# Patient Record
Sex: Male | Born: 1950 | Race: White | Hispanic: No | Marital: Married | Smoking: Never smoker
Health system: Southern US, Community
[De-identification: ages and names within clinical notes are randomized; demographics above are authoritative.]

## PROBLEM LIST (undated history)

## (undated) DIAGNOSIS — K649 Unspecified hemorrhoids: Secondary | ICD-10-CM

## (undated) DIAGNOSIS — D649 Anemia, unspecified: Secondary | ICD-10-CM

## (undated) DIAGNOSIS — K56609 Unspecified intestinal obstruction, unspecified as to partial versus complete obstruction: Secondary | ICD-10-CM

## (undated) DIAGNOSIS — K4090 Unilateral inguinal hernia, without obstruction or gangrene, not specified as recurrent: Secondary | ICD-10-CM

## (undated) HISTORY — DX: Unspecified hemorrhoids: K64.9

## (undated) HISTORY — DX: Unspecified intestinal obstruction, unspecified as to partial versus complete obstruction: K56.609

## (undated) HISTORY — PX: UMBILICAL HERNIA REPAIR: SHX196

## (undated) HISTORY — DX: Unilateral inguinal hernia, without obstruction or gangrene, not specified as recurrent: K40.90

## (undated) HISTORY — PX: TONSILLECTOMY: SUR1361

## (undated) HISTORY — DX: Anemia, unspecified: D64.9

---

## 2001-09-21 HISTORY — PX: COLONOSCOPY: SHX174

## 2001-09-22 ENCOUNTER — Encounter: Payer: Self-pay | Admitting: Internal Medicine

## 2001-09-24 ENCOUNTER — Encounter: Payer: Self-pay | Admitting: Internal Medicine

## 2009-11-26 ENCOUNTER — Encounter (INDEPENDENT_AMBULATORY_CARE_PROVIDER_SITE_OTHER): Payer: Self-pay | Admitting: *Deleted

## 2010-01-17 DIAGNOSIS — K649 Unspecified hemorrhoids: Secondary | ICD-10-CM | POA: Insufficient documentation

## 2010-01-17 DIAGNOSIS — K5900 Constipation, unspecified: Secondary | ICD-10-CM

## 2010-01-17 DIAGNOSIS — Z862 Personal history of diseases of the blood and blood-forming organs and certain disorders involving the immune mechanism: Secondary | ICD-10-CM

## 2010-07-24 NOTE — Letter (Signed)
Summary: New Patient letter  [facility]  [address] [place], [place] [address]   Phone: [phone]  Fax: [phone]       11/26/2009 MRN: [id number]  [facility] [address] [place], [place]  [address]  Dear Randy Morrison,  Welcome to the Gastroenterology Division at [facility].    You are scheduled to see Dr. Juanda Chance on 01/30/2010 at 1:30PM on the 3rd floor at [facility], [address]  We ask that you try to arrive at our office 15 minutes prior to your appointment time to allow for check-in.  We would like you to complete the enclosed self-administered evaluation form prior to your visit and bring it with you on the day of your appointment.  We will review it with you.  Also, please bring a complete list of all your medications or, if you prefer, bring the medication bottles and we will list them.  Please bring your insurance card so that we may make a copy of it.  If your insurance requires a referral to see a specialist, please bring your referral form from your primary care physician.  Co-payments are due at the time of your visit and may be paid by cash, check or credit card.     Your office visit will consist of a consult with your physician (includes a physical exam), any laboratory testing he/she may order, scheduling of any necessary diagnostic testing (e.g. x-ray, ultrasound, CT-scan), and scheduling of a procedure (e.g. Endoscopy, Colonoscopy) if required.  Please allow enough time on your schedule to allow for any/all of these possibilities.    If you cannot keep your appointment, please call [phone] to cancel or reschedule prior to your appointment date.  This allows [place] the opportunity to schedule an appointment for another patient in need of care.  If you do not cancel or reschedule by 5 p.m. the business day prior to your appointment date, you will be charged a $50.00 late cancellation/no-show fee.    Thank you for choosing [facility] for your medical needs.  We appreciate the opportunity to care for you.  Please visit [place] at our website  to learn more about our practice.                     Sincerely,                                                             The Gastroenterology Division

## 2010-07-24 NOTE — Procedures (Signed)
Summary: COLON   Colonoscopy  Procedure date:  09/24/2001  Findings:      Location:  [facility].    Procedures Next Due Date:    Colonoscopy: 09/2011 Patient Name: Eean, Buss MRN:  Procedure Procedures: Colonoscopy CPT: 16109.  Personnel: Endoscopist: Tasia Liz L. Juanda Chance, MD.  Referred By: Eugenio Hoes Tawanna Cooler, MD.  Exam Location: Exam performed in Outpatient Clinic. Outpatient  Patient Consent: Procedure, Alternatives, Risks and Benefits discussed, consent obtained, from patient. Consent was obtained by the RN.  Indications  Evaluation of: Anemia with low iron saturation. Positive fecal occult blood test per digital rectal exam.  Symptoms: Hematochezia.  History  Pre-Exam Physical: Performed Sep 24, 2001. Cardio-pulmonary exam, Rectal exam, HEENT exam , Abdominal exam, Extremity exam, Neurological exam, Mental status exam WNL.  Exam Exam: Extent of exam reached: Cecum, extent intended: Cecum.  The cecum was identified by appendiceal orifice and IC valve. Colon retroflexion performed. Images taken. ASA Classification: I. Tolerance: good.  Monitoring: Pulse and BP monitoring, Oximetry used. Supplemental O2 given.  Colon Prep Used Golytely for colon prep. Prep results: good.  Sedation Meds: Patient assessed and found to be appropriate for moderate (conscious) sedation. Fentanyl 100 mcg. given IV. Versed 6 mg. given IV.  Findings HEMORRHOIDS: Size: Grade II. Bleeding. Not thrombosed. ICD9: Hemorrhoids, Bleeding: 455.8. Comments: largen mixed memorrhoids.   Assessment Abnormal examination, see findings above.  Diagnoses: 455.8: Hemorrhoids, Bleeding.   Comments: bleeding hemorrhoids Events  Unplanned Interventions: No intervention was required.  Unplanned Events: There were no complications. Plans Medication Plan: Hemorrhoidal Medications: Anusol Suppositories 1 HS, starting Sep 24, 2001   Patient Education: Patient given standard instructions  for: Hemorrhoids. Yearly hemoccult testing recommended. Patient instructed to get routine colonoscopy every 10 years.  Disposition: After procedure patient sent to recovery. After recovery patient sent home.   This report was created from the original endoscopy report, which was reviewed and signed by the above listed endoscopist.

## 2010-07-24 NOTE — Letter (Signed)
Summary: Letter to Kelle Darting MD  Letter to Kelle Darting MD   Imported By: Lester Halstead 01/21/2010 08:26:39  _____________________________________________________________________  External Attachment:    Type:   Image     Comment:   External Document

## 2011-08-25 ENCOUNTER — Encounter: Payer: Self-pay | Admitting: Internal Medicine

## 2012-04-14 ENCOUNTER — Encounter: Payer: Self-pay | Admitting: Internal Medicine

## 2013-12-29 ENCOUNTER — Encounter (HOSPITAL_COMMUNITY): Payer: Self-pay | Admitting: Emergency Medicine

## 2013-12-29 ENCOUNTER — Inpatient Hospital Stay (HOSPITAL_COMMUNITY)
Admission: EM | Admit: 2013-12-29 | Discharge: 2013-12-31 | DRG: 390 | Disposition: A | Discharge status: Home / Self Care | Payer: Medicare HMO | Attending: Internal Medicine | Admitting: Internal Medicine

## 2013-12-29 ENCOUNTER — Emergency Department (HOSPITAL_COMMUNITY): Payer: Medicare HMO

## 2013-12-29 DIAGNOSIS — D649 Anemia, unspecified: Secondary | ICD-10-CM | POA: Diagnosis present

## 2013-12-29 DIAGNOSIS — E860 Dehydration: Secondary | ICD-10-CM | POA: Diagnosis present

## 2013-12-29 DIAGNOSIS — R109 Unspecified abdominal pain: Secondary | ICD-10-CM | POA: Diagnosis present

## 2013-12-29 DIAGNOSIS — K56609 Unspecified intestinal obstruction, unspecified as to partial versus complete obstruction: Secondary | ICD-10-CM | POA: Diagnosis present

## 2013-12-29 DIAGNOSIS — K560 Paralytic ileus: Secondary | ICD-10-CM

## 2013-12-29 DIAGNOSIS — K567 Ileus, unspecified: Secondary | ICD-10-CM

## 2013-12-29 DIAGNOSIS — D72819 Decreased white blood cell count, unspecified: Secondary | ICD-10-CM | POA: Diagnosis present

## 2013-12-29 DIAGNOSIS — K5900 Constipation, unspecified: Secondary | ICD-10-CM | POA: Diagnosis present

## 2013-12-29 DIAGNOSIS — R52 Pain, unspecified: Secondary | ICD-10-CM

## 2013-12-29 DIAGNOSIS — Z862 Personal history of diseases of the blood and blood-forming organs and certain disorders involving the immune mechanism: Secondary | ICD-10-CM

## 2013-12-29 DIAGNOSIS — K649 Unspecified hemorrhoids: Secondary | ICD-10-CM

## 2013-12-29 LAB — COMPREHENSIVE METABOLIC PANEL
ALK PHOS: 67 U/L (ref 39–117)
ALT: 27 U/L (ref 0–53)
AST: 29 U/L (ref 0–37)
Albumin: 4.6 g/dL (ref 3.5–5.2)
Anion gap: 14 (ref 5–15)
BILIRUBIN TOTAL: 0.4 mg/dL (ref 0.3–1.2)
BUN: 8 mg/dL (ref 6–23)
CHLORIDE: 102 meq/L (ref 96–112)
CO2: 25 meq/L (ref 19–32)
Calcium: 9.6 mg/dL (ref 8.4–10.5)
Creatinine, Ser: 1.02 mg/dL (ref 0.50–1.35)
GFR calc Af Amer: 87 mL/min — ABNORMAL LOW (ref >90)
GFR calc non Af Amer: 75 mL/min — ABNORMAL LOW (ref >90)
Glucose, Bld: 129 mg/dL — ABNORMAL HIGH (ref 70–99)
POTASSIUM: 3.9 meq/L (ref 3.7–5.3)
Sodium: 141 mEq/L (ref 137–147)
Total Protein: 8.1 g/dL (ref 6.0–8.3)

## 2013-12-29 LAB — URINALYSIS, ROUTINE W REFLEX MICROSCOPIC
BILIRUBIN URINE: NEGATIVE
GLUCOSE, UA: NEGATIVE mg/dL
HGB URINE DIPSTICK: NEGATIVE
Ketones, ur: 40 mg/dL — AB
Leukocytes, UA: NEGATIVE
Nitrite: NEGATIVE
PH: 6.5 (ref 5.0–8.0)
Protein, ur: NEGATIVE mg/dL
SPECIFIC GRAVITY, URINE: 1.022 (ref 1.005–1.030)
Urobilinogen, UA: 0.2 mg/dL (ref 0.0–1.0)

## 2013-12-29 LAB — CBC WITH DIFFERENTIAL/PLATELET
BASOS ABS: 0 10*3/uL (ref 0.0–0.1)
Basophils Relative: 0 % (ref 0–1)
Eosinophils Absolute: 0 10*3/uL (ref 0.0–0.7)
Eosinophils Relative: 0 % (ref 0–5)
HCT: 43.2 % (ref 39.0–52.0)
HEMOGLOBIN: 14.7 g/dL (ref 13.0–17.0)
LYMPHS PCT: 8 % — AB (ref 12–46)
Lymphs Abs: 0.7 10*3/uL (ref 0.7–4.0)
MCH: 29.8 pg (ref 26.0–34.0)
MCHC: 34 g/dL (ref 30.0–36.0)
MCV: 87.6 fL (ref 78.0–100.0)
MONOS PCT: 4 % (ref 3–12)
Monocytes Absolute: 0.4 10*3/uL (ref 0.1–1.0)
NEUTROS ABS: 8 10*3/uL — AB (ref 1.7–7.7)
NEUTROS PCT: 88 % — AB (ref 43–77)
Platelets: 212 10*3/uL (ref 150–400)
RBC: 4.93 MIL/uL (ref 4.22–5.81)
RDW: 13.8 % (ref 11.5–15.5)
WBC: 9.1 10*3/uL (ref 4.0–10.5)

## 2013-12-29 LAB — I-STAT TROPONIN, ED: Troponin i, poc: 0 ng/mL (ref 0.00–0.08)

## 2013-12-29 LAB — LIPASE, BLOOD: Lipase: 26 U/L (ref 11–59)

## 2013-12-29 LAB — I-STAT CG4 LACTIC ACID, ED: Lactic Acid, Venous: 0.95 mmol/L (ref 0.5–2.2)

## 2013-12-29 MED ORDER — ONDANSETRON HCL 4 MG PO TABS: 4.0000 mg | ORAL_TABLET | Freq: Four times a day (QID) | ORAL | Status: DC | PRN

## 2013-12-29 MED ORDER — SODIUM CHLORIDE 0.9 % IV SOLN
1000.0000 mL | Freq: Once | INTRAVENOUS | Status: AC
  Administered 2013-12-29: 1000 mL via INTRAVENOUS

## 2013-12-29 MED ORDER — SODIUM CHLORIDE 0.9 % IV SOLN
INTRAVENOUS | Status: DC
  Administered 2013-12-29 – 2013-12-31 (×3): via INTRAVENOUS

## 2013-12-29 MED ORDER — IOHEXOL 300 MG/ML  SOLN
100.0000 mL | Freq: Once | INTRAMUSCULAR | Status: AC | PRN
  Administered 2013-12-29: 100 mL via INTRAVENOUS

## 2013-12-29 MED ORDER — SODIUM CHLORIDE 0.9 % IV BOLUS (SEPSIS)
1000.0000 mL | Freq: Once | INTRAVENOUS | Status: AC
  Administered 2013-12-29: 1000 mL via INTRAVENOUS

## 2013-12-29 MED ORDER — ONDANSETRON HCL 4 MG/2ML IJ SOLN
4.0000 mg | Freq: Once | INTRAMUSCULAR | Status: AC
  Administered 2013-12-29: 4 mg via INTRAVENOUS
  Filled 2013-12-29: qty 2

## 2013-12-29 MED ORDER — HYDROMORPHONE HCL PF 1 MG/ML IJ SOLN
1.0000 mg | Freq: Once | INTRAMUSCULAR | Status: AC
Start: 2013-12-29 — End: 2013-12-29
  Administered 2013-12-29: 1 mg via INTRAVENOUS
  Filled 2013-12-29: qty 1

## 2013-12-29 MED ORDER — ENOXAPARIN SODIUM 40 MG/0.4ML ~~LOC~~ SOLN
40.0000 mg | SUBCUTANEOUS | Status: DC
  Filled 2013-12-29 (×3): qty 0.4

## 2013-12-29 MED ORDER — MORPHINE SULFATE 2 MG/ML IJ SOLN: 1.0000 mg | INTRAMUSCULAR | Status: DC | PRN

## 2013-12-29 MED ORDER — ONDANSETRON HCL 4 MG/2ML IJ SOLN: 4.0000 mg | Freq: Four times a day (QID) | INTRAMUSCULAR | Status: DC | PRN

## 2013-12-29 MED ORDER — HYDROMORPHONE HCL PF 1 MG/ML IJ SOLN
1.0000 mg | Freq: Once | INTRAMUSCULAR | Status: AC | PRN
  Administered 2013-12-29: 1 mg via INTRAVENOUS
  Filled 2013-12-29: qty 1

## 2013-12-29 NOTE — ED Provider Notes (Signed)
Patient reports acute onset of upper abdominal pain that radiates into his back about 8:30 this morning. He complains of abdominal bloating. He has had nausea and vomiting x3, last time about 1 PM. His last normal bowel movement was yesterday. Patient denies any prior abdominal surgeries. He states he's never had this before. Patient states he has a left inguinal hernia that he has been evaluated for last fall. He plans to have it repaired this coming winter.  Patient has some mild distention of his abdomen. He has a soft wide-based hernia in his left inguinal area that is easily reducible. The hernia feel soft.  Medical screening examination/treatment/procedure(s) were conducted as a shared visit with non-physician practitioner(s) and myself.  I personally evaluated the patient during the encounter.   EKG Interpretation   Date/Time:  Thursday December 29 2013 17:39:38 EDT Ventricular Rate:  61 PR Interval:  181 QRS Duration: 96 QT Interval:  417 QTC Calculation: 420 R Axis:   65 Text Interpretation:  Sinus rhythm Normal ECG No old tracing to compare  Confirmed by Kyrie Fludd  MD-I, Ayeza Therriault ([id number]) on 12/29/2013 6:19:22 PM       Devoria AlbeIva Akili Cuda, MD, Franz DellFACEP   Luby Seamans L Demyan Fugate, MD 12/29/13 [id number]

## 2013-12-29 NOTE — ED Provider Notes (Signed)
CSN: [id number]     Arrival date & time 12/29/13  1358 History   First MD Initiated Contact with Patient 12/29/13 1649     No chief complaint on file.    (Consider location/radiation/quality/duration/timing/severity/associated sxs/prior Treatment) HPI Comments: Randy Morrison is a(n) 63 y.o. male who presents with chief complaint of severe abdominal pain, nausea and vomiting. Patient states that this morning he was outside and put out about 30 bags of mulch. He came in to sit down and have a cup of coffee. He had a sudden onset severe epigastric and umbilical abdominal pain radiating to the bilateral upper abdominal quadrants and bilateral flanks. He had associated nausea and several episodes of vomiting nonbilious nonbloody vomitus. He states that his pain is decreasing somewhat and rates it now at about an 8/10. He states that he's never had any pain like this before. He denies any urinary symptoms or history of kidney stones. He has no risk factors for acute coronary syndrome other than age. He denies fevers or myalgias. He endorses chills. He denies any ingestion of suspicious foods, contacts with similar symptoms. Patient denies diarrhea, constipation, previous abdominal surgeries, melena or hematochezia. Patient does endorse a left-sided inguinal hernia although he has no pain there presently. Denies a history of hiatal hernia or GERD. He takes no medications on a regular basis.  Patient is a 63 y.o. male presenting with abdominal pain. The history is provided by the patient.  Abdominal Pain Pain location:  Epigastric, L flank, R flank, RUQ and RLQ Pain quality: aching and sharp   Pain quality: not gnawing, not shooting and not tearing   Pain radiates to:  L flank and R flank Pain severity:  Severe Duration:  5 hours Timing:  Constant Progression:  Waxing and waning Chronicity:  New Context: not alcohol use, not awakening from sleep, not diet changes, not eating, not laxative use, not  medication withdrawal, not previous surgeries, not recent illness, not recent sexual activity, not recent travel, not retching, not sick contacts, not suspicious food intake and not trauma   Relieved by:  Nothing Worsened by:  Nothing tried Ineffective treatments: soda crackers at home. Associated symptoms: belching, nausea and vomiting   Associated symptoms: no chest pain, no chills, no constipation, no cough, no diarrhea, no dysuria, no fever, no flatus, no hematemesis, no hematochezia, no hematuria, no melena, no shortness of breath and no sore throat     History reviewed. No pertinent past medical history. Past Surgical History  Procedure Laterality Date  . Tonsillectomy     History reviewed. No pertinent family history. History  Substance Use Topics  . Smoking status: Never Smoker   . Smokeless tobacco: Not on file  . Alcohol Use: Not on file    Review of Systems  Constitutional: Negative for fever and chills.  HENT: Negative for sore throat.   Respiratory: Negative for cough and shortness of breath.   Cardiovascular: Negative for chest pain.  Gastrointestinal: Positive for nausea, vomiting and abdominal pain. Negative for diarrhea, constipation, melena, hematochezia, flatus and hematemesis.  Genitourinary: Positive for flank pain. Negative for dysuria, frequency and hematuria.  Skin: Negative for color change and rash.  All other systems reviewed and are negative.     Allergies  Review of patient's allergies indicates no known allergies.  Home Medications   Prior to Admission medications   Medication Sig Start Date End Date Taking? Authorizing Provider  hydrocortisone cream 1 % Apply 1 application topically as needed for itching.  Yes Historical Provider, MD  ibuprofen (ADVIL,MOTRIN) 200 MG tablet Take 400 mg by mouth every 6 (six) hours as needed.   Yes Historical Provider, MD  Menthol, Topical Analgesic, (BLUE-EMU MAXIMUM STRENGTH EX) Apply 1 application topically  as needed (for pain).   Yes Historical Provider, MD   BP 130/71  Pulse 64  Temp(Src) 97.7 F (36.5 C) (Oral)  Resp 19  SpO2 99% Physical Exam  Nursing note and vitals reviewed. Constitutional: He is oriented to person, place, and time. He appears well-developed and well-nourished. No distress.  Pale, ill appearing man in NAD. Tremulous and appears extremely uncomfortable.  HENT:  Head: Normocephalic and atraumatic.  Eyes: Conjunctivae are normal. No scleral icterus.  Neck: Normal range of motion. Neck supple.  Cardiovascular: Normal rate, regular rhythm and normal heart sounds.   Pulmonary/Chest: Effort normal and breath sounds normal. No respiratory distress.  Abdominal: Soft. There is tenderness.  Tender to palpation epigastrium, BL upper quadrants. No CVA tenderness. Large inguinal hernia left lower abdomen is soft and easily reducible.  Musculoskeletal: He exhibits no edema.  Neurological: He is alert and oriented to person, place, and time.  Skin: Skin is warm. He is diaphoretic.  Psychiatric: His behavior is normal.    ED Course  Procedures (including critical care time) Labs Review Labs Reviewed  CBC WITH DIFFERENTIAL - Abnormal; Notable for the following:    Neutrophils Relative % 88 (*)    Neutro Abs 8.0 (*)    Lymphocytes Relative 8 (*)    All other components within normal limits  COMPREHENSIVE METABOLIC PANEL - Abnormal; Notable for the following:    Glucose, Bld 129 (*)    GFR calc non Af Amer 75 (*)    GFR calc Af Amer 87 (*)    All other components within normal limits  URINALYSIS, ROUTINE W REFLEX MICROSCOPIC - Abnormal; Notable for the following:    APPearance CLOUDY (*)    Ketones, ur 40 (*)    All other components within normal limits  LIPASE, BLOOD  I-STAT TROPOININ, ED  I-STAT CG4 LACTIC ACID, ED    Imaging Review No results found.   EKG Interpretation   Date/Time:  Thursday December 29 2013 17:39:38 EDT Ventricular Rate:  61 PR Interval:   181 QRS Duration: 96 QT Interval:  417 QTC Calculation: 420 R Axis:   65 Text Interpretation:  Sinus rhythm Normal ECG No old tracing to compare  Confirmed by KNAPP  MD-I, IVA ([id number]) on 12/29/2013 6:19:22 PM      MDM   Final diagnoses:  Ileus of unspecified type   Patient presents with acute onset abdominal pain, nausea, and vomiting. He appears extremely uncomfortable. Initial labs at triage show left shift without leukocytosis and mild hyperglycemia.  I will obtain acute abdominal films. Differential includes biliary colic, pancreatitis, gastritis, and ACS which is low on my differential as patient's only RF is his age.   Patient xray shows SBO.  His pain is greatly decreased and the patient appears more comfortable on exam. He has no RF for SBO, no previous surgeries. He denies fevers, fatigue, night sweats, unexplained weight loss. I have discussed the findings with the patient. Will obtain CT with IV contrast.  Negative ekg and troponin.   CT shows Ileus vs. SBO. No masses, volvulus or intussusception. I have discussed the findings with Dr. Lindie SpruceWyatt of [facility] who asks for a lactic acid level on the patient which will determine who admits. If negative, will treat with bowel  rest.  Stable vitals.negative lactate. Dr. Benjamine Mola will admit to medsurg. Pain is returning and will redose meds. Patient / Family / Caregiver informed of clinical course, understand medical decision-making process, and agree with plan.  I personally reviewed the imaging tests through PACS system. I have reviewed and interpreted Lab values. I reviewed available ER/hospitalization records through the EMR   The patient appears reasonably stabilized for admission considering the current resources, flow, and capabilities available in the ED at this time, and I doubt any other [facility] requiring further screening and/or treatment in the ED prior to admission.    Arthor Captain, PA-C 12/30/13 1029

## 2013-12-29 NOTE — Progress Notes (Signed)
Received pt report from Mica,RN-ED. 

## 2013-12-29 NOTE — H&P (Addendum)
[facility] History and Physical  BORIS ENGELMANN ZOX:[id number] DOB: February 08, 1951 DOA: 12/29/2013  Referring physician: er PCP: Pcp Not In System   Chief Complaint: abd pain  HPI: Randy Morrison is a 63 y.o. male  Who is Healthy.  Patient comes to the ER with complaints of severe abdominal pain, nausea and vomiting. Patient vomited several times after abdominal pain developed. Patient is active this a.m. laying out bags of mulch. Abdominal pain developed when he came inside and had a cup of coffee. Pain was sudden in onset around his umbilicus. Patient denies any history of abdominal surgeries or abdominal troubles in the past. Patient denies any sick contacts. No diarrhea or other gastrointestinal illness. Patient reports a colonoscopy about 10 years ago by Dr. Juanda Chance which was normal Last bowel movement yesterday and was normal. No blood.  In the ER, patient's nausea has resolved and no NG tube was placed. A CT scan was done that showed a mild small bowel ileus or partial extraction. Lab results were normal.   Review of Systems:  All systems reviewed, negative unless stated above   History reviewed. No pertinent past medical history. Past Surgical History  Procedure Laterality Date  . Tonsillectomy     Social History:  reports that he has never smoked. He does not have any smokeless tobacco history on file. His alcohol and drug histories are not on file.  No Known Allergies  History reviewed. No pertinent family history.   Prior to Admission medications   Medication Sig Start Date End Date Taking? Authorizing Provider  hydrocortisone cream 1 % Apply 1 application topically as needed for itching.   Yes Historical Provider, MD  ibuprofen (ADVIL,MOTRIN) 200 MG tablet Take 400 mg by mouth every 6 (six) hours as needed.   Yes Historical Provider, MD  Menthol, Topical Analgesic, (BLUE-EMU MAXIMUM STRENGTH EX) Apply 1 application topically as needed (for pain).   Yes Historical  Provider, MD   Physical Exam: Filed Vitals:   12/29/13 2200  BP: 127/71  Pulse: 57  Temp:   Resp: 16    BP 127/71  Pulse 57  Temp(Src) 97.7 F (36.5 C) (Oral)  Resp 16  SpO2 98%  General:  Appears calm and comfortable Eyes: PERRL, normal lids, irises & conjunctiva ENT: grossly normal hearing, lips & tongue Neck: no LAD, masses or thyromegaly Cardiovascular: RRR, no m/r/g. No LE edema. Telemetry: SR, no arrhythmias  Respiratory: CTA bilaterally, no w/r/r. Normal respiratory effort. Abdomen: decreased BS, mild tenderness in UQ Skin: no rash or induration seen on limited exam Musculoskeletal: grossly normal tone BUE/BLE Psychiatric: grossly normal mood and affect, speech fluent and appropriate Neurologic: grossly non-focal.          Labs on Admission:  Basic Metabolic Panel:  Recent Labs Lab 12/29/13 1429  NA 141  K 3.9  CL 102  CO2 25  GLUCOSE 129*  BUN 8  CREATININE 1.02  CALCIUM 9.6   Liver Function Tests:  Recent Labs Lab 12/29/13 1429  AST 29  ALT 27  ALKPHOS 67  BILITOT 0.4  PROT 8.1  ALBUMIN 4.6    Recent Labs Lab 12/29/13 1429  LIPASE 26   No results found for this basename: AMMONIA,  in the last 168 hours CBC:  Recent Labs Lab 12/29/13 1429  WBC 9.1  NEUTROABS 8.0*  HGB 14.7  HCT 43.2  MCV 87.6  PLT 212   Cardiac Enzymes: No results found for this basename: CKTOTAL, CKMB, CKMBINDEX, TROPONINI,  in  the last 168 hours  BNP (last 3 results) No results found for this basename: PROBNP,  in the last 8760 hours CBG: No results found for this basename: GLUCAP,  in the last 168 hours  Radiological Exams on Admission: Ct Abdomen Pelvis W Contrast  12/29/2013   CLINICAL DATA:  Generalized abdominal pain with nausea and vomiting  EXAM: CT ABDOMEN AND PELVIS WITH CONTRAST  TECHNIQUE: Multidetector CT imaging of the abdomen and pelvis was performed using the standard protocol following bolus administration of intravenous contrast.   CONTRAST:  OMNIPAQUE IOHEXOL 300 MG/ML SOLN intravenously ; the patient did not receive oral contrast material.  COMPARISON:  Acute abdominal series of today's date  FINDINGS: The stomach contains a small to moderate volume of fluid but is not abnormally distended. The duodenum is normal. There are loops of mildly distended fluid filled mid small bowel in the midline and to the right. A transition zone is suspected just to the left of midline on images 44 through 65. Mildly increased density in the mesenteric fat here suggests inflammation. Distal to this the small bowel caliber is normal. No intussusception is visible. The terminal ileum is unremarkable. The appendix is normal. There is a normal stool and gas pattern within the colon. There is no umbilical hernia. There is a moderate-sized inguinal hernia on the left containing fat and a smaller fat containing right inguinal hernia. No definite internal hernia is demonstrated.  The liver, gallbladder, spleen, pancreas, adrenal glands, and kidneys are normal. The abdominal aorta is normal. The urinary bladder and prostate gland are normal. There is a small amount of free fluid in the pelvis.  There is degenerative disc change at L4-5 and L5-S1. The bony pelvis is unremarkable. There is bibasilar compressive atelectasis.  IMPRESSION: 1. The bowel gas pattern is consistent with a mid small bowel ileus or partial obstruction. This may be related to adhesions but reportedly the patient has had no abdominal surgery. The terminal ileum, appendix, and colon are unremarkable. 2. There is a small amount of free pelvic fluid but no free extraluminal gas. There is no intra-abdominal abscess. 3. There is a large left inguinal hernia and small right inguinal hernia. Both contain fat but no bowel.   Electronically Signed   By: David  [place]   On: 12/29/2013 20:35   Dg Abd Acute W/chest  12/29/2013   CLINICAL DATA:  Stomach pain with nausea and vomiting  EXAM: ACUTE  ABDOMEN SERIES (ABDOMEN 2 VIEW & CHEST 1 VIEW)  COMPARISON:  None.  FINDINGS: The lungs are clear. The heart and mediastinal structures are normal. Within the abdomen there small bowel air-fluid levels. There is a moderate stool burden in the colon without evidence of obstruction. There are phleboliths in the pelvis. The bony thorax and the lumbar spine and visualized portions of the pelvis are normal.  IMPRESSION: The bowel gas pattern is consistent with an ileus or mid to distal partial small bowel obstruction. There is no evidence of perforation. There is no acute cardiopulmonary abnormality.   Electronically Signed   By: David  [place]   On: 12/29/2013 18:49    EKG: Independently reviewed. sinus  Assessment/Plan Active Problems:   SBO (small bowel obstruction)   Abdominal pain, acute   Ileus vs partial SBO- no NG tube placed as not vomiting currently, bowel rest- NPO. IVF, pain  Medications if needed If patient develops further abdominal pain nausea vomiting an NG tube will need to be placed. Spoke at length with  patient and his wife who are agreeable   Abdominal pain- see above  ER spoke with Dr. Lindie SpruceWyatt who was NOT formally consulted- recommended medical admission   Code Status: full Family Communication: patient/wife Disposition Plan:   Time spent: 65 min  Marlin CanaryVANN, Karlita Lichtman [facility] Pager [phone]  **Disclaimer: This note may have been dictated with voice recognition software. Similar sounding words can inadvertently be transcribed and this note may contain transcription errors which may not have been corrected upon publication of note.**

## 2013-12-29 NOTE — ED Notes (Signed)
Pt presents with onset of mid-abdominal pain, nausea and vomiting.  Pt reports pain has been constant and radiates around to both flanks.

## 2013-12-30 LAB — CBC
HEMATOCRIT: 35.5 % — AB (ref 39.0–52.0)
Hemoglobin: 11.9 g/dL — ABNORMAL LOW (ref 13.0–17.0)
MCH: 29.2 pg (ref 26.0–34.0)
MCHC: 33.5 g/dL (ref 30.0–36.0)
MCV: 87 fL (ref 78.0–100.0)
Platelets: 184 10*3/uL (ref 150–400)
RBC: 4.08 MIL/uL — AB (ref 4.22–5.81)
RDW: 14.3 % (ref 11.5–15.5)
WBC: 4.7 10*3/uL (ref 4.0–10.5)

## 2013-12-30 LAB — BASIC METABOLIC PANEL
ANION GAP: 14 (ref 5–15)
BUN: 10 mg/dL (ref 6–23)
CO2: 23 mEq/L (ref 19–32)
Calcium: 8.5 mg/dL (ref 8.4–10.5)
Chloride: 103 mEq/L (ref 96–112)
Creatinine, Ser: 1.08 mg/dL (ref 0.50–1.35)
GFR calc Af Amer: 81 mL/min — ABNORMAL LOW (ref >90)
GFR, EST NON AFRICAN AMERICAN: 70 mL/min — AB (ref >90)
Glucose, Bld: 105 mg/dL — ABNORMAL HIGH (ref 70–99)
Potassium: 4 mEq/L (ref 3.7–5.3)
SODIUM: 140 meq/L (ref 137–147)

## 2013-12-30 LAB — GLUCOSE, CAPILLARY: Glucose-Capillary: 95 mg/dL (ref 70–99)

## 2013-12-30 LAB — T4, FREE: Free T4: 1.05 ng/dL (ref 0.80–1.80)

## 2013-12-30 LAB — TSH: TSH: 0.709 u[IU]/mL (ref 0.350–4.500)

## 2013-12-30 MED ORDER — POLYETHYLENE GLYCOL 3350 17 G PO PACK
17.0000 g | PACK | Freq: Every day | ORAL | Status: DC
  Administered 2013-12-30 – 2013-12-31 (×2): 17 g via ORAL
  Filled 2013-12-30 (×2): qty 1

## 2013-12-30 NOTE — Care Management Note (Signed)
    Page 1 of 1   12/30/2013     2:46:40 PM CARE MANAGEMENT NOTE 12/30/2013  Patient:  Randy Morrison,Randy Morrison   Account Number:  [id number]  Date Initiated:  12/30/2013  Documentation initiated by:  Letha CapeAYLOR,Sirus Labrie  Subjective/Objective Assessment:   dx abd pain , sbo/ileus  admit- lives with spouse.     Action/Plan:   xray in am   Anticipated DC Date:  12/31/2013   Anticipated DC Plan:  HOME/SELF CARE      DC Planning Services  CM consult      Choice offered to / List presented to:             Status of service:  In process, will continue to follow Medicare Important Message given?   (If response is "NO", the following Medicare IM given date fields will be blank) Date Medicare IM given:   Medicare IM given by:   Date Additional Medicare IM given:   Additional Medicare IM given by:    Discharge Disposition:    Per UR Regulation:    If discussed at Long Length of Stay Meetings, dates discussed:    Comments:

## 2013-12-30 NOTE — Progress Notes (Signed)
Utilization review completed.  

## 2013-12-30 NOTE — Progress Notes (Signed)
[facility] PROGRESS NOTE  TAYTE MCWHERTER AVW:[id number] DOB: 1950-11-04 DOA: 12/29/2013 PCP: Pcp Not In System  Assessment/Plan: 1-abdominal ileus/partial SBO: patient w/o any further abd pain, nausea or vomiting.  -will check TSH and free T4 -will advance diet to full liquid diet and start miralax -continue supportive care and IVF's  2-dehydration: continue IVF's; now that diet will be advance to full liquid; patient advise to increase fluid intake  3-DVT: lovenox.  Code Status: Full Family Communication: wife at bedside  Disposition Plan: home most likely on 7/11 (patient with resolving ileus, follow abd x-ray; no BM's yet)   Consultants:  None   Procedures:  See below for x-ray reports   Antibiotics:  None   HPI/Subjective: AAOX3; denies CP, SOB, nausea, vomiting and is afebrile. Patient is passing gas, but no BM's yet  Objective: Filed Vitals:   12/30/13 0547  BP: 118/74  Pulse: 58  Temp: 98.2 F (36.8 C)  Resp: 16    Intake/Output Summary (Last 24 hours) at 12/30/13 1107 Last data filed at 12/30/13 0754  Gross per 24 hour  Intake     75 ml  Output      0 ml  Net     75 ml   Filed Weights   12/29/13 2329  Weight: 103.874 kg (229 lb)    Exam:   General:  AAOX3; denies CP, SOB, nausea, vomiting and is afebrile.  Cardiovascular: S1 and S2, no rubs or gallops  Respiratory: CTA bilaterally  Abdomen: soft, NT, ND, positive BS; no guarding or rebounds  Musculoskeletal: no edema or cyanosis   Data Reviewed: Basic Metabolic Panel:  Recent Labs Lab 12/29/13 1429 12/30/13 0745  NA 141 140  K 3.9 4.0  CL 102 103  CO2 25 23  GLUCOSE 129* 105*  BUN 8 10  CREATININE 1.02 1.08  CALCIUM 9.6 8.5   Liver Function Tests:  Recent Labs Lab 12/29/13 1429  AST 29  ALT 27  ALKPHOS 67  BILITOT 0.4  PROT 8.1  ALBUMIN 4.6    Recent Labs Lab 12/29/13 1429  LIPASE 26   CBC:  Recent Labs Lab 12/29/13 1429 12/30/13 0745  WBC 9.1 4.7   NEUTROABS 8.0*  --   HGB 14.7 11.9*  HCT 43.2 35.5*  MCV 87.6 87.0  PLT 212 184   CBG:  Recent Labs Lab 12/30/13 0757  GLUCAP 95   Studies: Ct Abdomen Pelvis W Contrast  12/29/2013   CLINICAL DATA:  Generalized abdominal pain with nausea and vomiting  EXAM: CT ABDOMEN AND PELVIS WITH CONTRAST  TECHNIQUE: Multidetector CT imaging of the abdomen and pelvis was performed using the standard protocol following bolus administration of intravenous contrast.  CONTRAST:  OMNIPAQUE IOHEXOL 300 MG/ML SOLN intravenously ; the patient did not receive oral contrast material.  COMPARISON:  Acute abdominal series of today's date  FINDINGS: The stomach contains a small to moderate volume of fluid but is not abnormally distended. The duodenum is normal. There are loops of mildly distended fluid filled mid small bowel in the midline and to the right. A transition zone is suspected just to the left of midline on images 44 through 65. Mildly increased density in the mesenteric fat here suggests inflammation. Distal to this the small bowel caliber is normal. No intussusception is visible. The terminal ileum is unremarkable. The appendix is normal. There is a normal stool and gas pattern within the colon. There is no umbilical hernia. There is a moderate-sized inguinal hernia on  the left containing fat and a smaller fat containing right inguinal hernia. No definite internal hernia is demonstrated.  The liver, gallbladder, spleen, pancreas, adrenal glands, and kidneys are normal. The abdominal aorta is normal. The urinary bladder and prostate gland are normal. There is a small amount of free fluid in the pelvis.  There is degenerative disc change at L4-5 and L5-S1. The bony pelvis is unremarkable. There is bibasilar compressive atelectasis.  IMPRESSION: 1. The bowel gas pattern is consistent with a mid small bowel ileus or partial obstruction. This may be related to adhesions but reportedly the patient has had no  abdominal surgery. The terminal ileum, appendix, and colon are unremarkable. 2. There is a small amount of free pelvic fluid but no free extraluminal gas. There is no intra-abdominal abscess. 3. There is a large left inguinal hernia and small right inguinal hernia. Both contain fat but no bowel.   Electronically Signed   By: David  [place]   On: 12/29/2013 20:35   Dg Abd Acute W/chest  12/29/2013   CLINICAL DATA:  Stomach pain with nausea and vomiting  EXAM: ACUTE ABDOMEN SERIES (ABDOMEN 2 VIEW & CHEST 1 VIEW)  COMPARISON:  None.  FINDINGS: The lungs are clear. The heart and mediastinal structures are normal. Within the abdomen there small bowel air-fluid levels. There is a moderate stool burden in the colon without evidence of obstruction. There are phleboliths in the pelvis. The bony thorax and the lumbar spine and visualized portions of the pelvis are normal.  IMPRESSION: The bowel gas pattern is consistent with an ileus or mid to distal partial small bowel obstruction. There is no evidence of perforation. There is no acute cardiopulmonary abnormality.   Electronically Signed   By: David  [place]   On: 12/29/2013 18:49    Scheduled Meds: . enoxaparin (LOVENOX) injection  40 mg Subcutaneous Q24H  . polyethylene glycol  17 g Oral Daily   Continuous Infusions: . sodium chloride 75 mL/hr at 12/29/13 2323    Time spent: <30 minutes    Vassie LollMadera, Laterrian Hevener  [facility] Pager [phone]. If 7PM-7AM, please contact night-coverage at [email or web address], password [email or web address] 12/30/2013, 11:07 AM  LOS: 1 day

## 2013-12-30 NOTE — Progress Notes (Signed)
Pt arrived on unit, alert and oriented x4. Able to make needs known. In no acute distress, no SOB noted. RAC IV site clean, dry and intact. Vital signs taken and stable. Skin intact as assessed, noted scratch marks on BLE/BUE. Wife at bedside. Pt care guide provided. Oriented to room and staff. Call light place within reached. We will continue to monitor.

## 2013-12-30 NOTE — ED Provider Notes (Signed)
See prior note   Ward GivensIva L Britten Seyfried, MD 12/30/13 1409

## 2013-12-31 ENCOUNTER — Inpatient Hospital Stay (HOSPITAL_COMMUNITY): Payer: Medicare HMO

## 2013-12-31 DIAGNOSIS — D72819 Decreased white blood cell count, unspecified: Secondary | ICD-10-CM

## 2013-12-31 DIAGNOSIS — K5900 Constipation, unspecified: Secondary | ICD-10-CM

## 2013-12-31 LAB — BASIC METABOLIC PANEL
Anion gap: 12 (ref 5–15)
BUN: 8 mg/dL (ref 6–23)
CO2: 25 meq/L (ref 19–32)
Calcium: 8.6 mg/dL (ref 8.4–10.5)
Chloride: 104 mEq/L (ref 96–112)
Creatinine, Ser: 1.05 mg/dL (ref 0.50–1.35)
GFR calc Af Amer: 84 mL/min — ABNORMAL LOW (ref >90)
GFR calc non Af Amer: 73 mL/min — ABNORMAL LOW (ref >90)
GLUCOSE: 113 mg/dL — AB (ref 70–99)
Potassium: 4 mEq/L (ref 3.7–5.3)
SODIUM: 141 meq/L (ref 137–147)

## 2013-12-31 LAB — CBC
HCT: 35.2 % — ABNORMAL LOW (ref 39.0–52.0)
Hemoglobin: 11.7 g/dL — ABNORMAL LOW (ref 13.0–17.0)
MCH: 29.1 pg (ref 26.0–34.0)
MCHC: 33.2 g/dL (ref 30.0–36.0)
MCV: 87.6 fL (ref 78.0–100.0)
Platelets: 181 10*3/uL (ref 150–400)
RBC: 4.02 MIL/uL — AB (ref 4.22–5.81)
RDW: 14.3 % (ref 11.5–15.5)
WBC: 3.4 10*3/uL — ABNORMAL LOW (ref 4.0–10.5)

## 2013-12-31 MED ORDER — POLYETHYLENE GLYCOL 3350 17 G PO PACK: 17.0000 g | PACK | Freq: Every day | ORAL | Status: DC

## 2013-12-31 MED ORDER — DOCUSATE SODIUM 283 MG RE ENEM
1.0000 | ENEMA | Freq: Once | RECTAL | Status: AC | PRN
  Administered 2013-12-31: 283 mg via RECTAL
  Filled 2013-12-31: qty 1
  Filled 2013-12-31: qty 0.2

## 2013-12-31 MED ORDER — BISACODYL 10 MG RE SUPP
10.0000 mg | Freq: Once | RECTAL | Status: AC
  Administered 2013-12-31: 10 mg via RECTAL
  Filled 2013-12-31: qty 1

## 2013-12-31 NOTE — Progress Notes (Signed)
Randy Morrison to be D/C'd Home per MD order.  Discussed with the patient and all questions fully answered.    Medication List    STOP taking these medications       ibuprofen 200 MG tablet  Commonly known as:  ADVIL,MOTRIN      TAKE these medications       BLUE-EMU MAXIMUM STRENGTH EX  Apply 1 application topically as needed (for pain).     hydrocortisone cream 1 %  Apply 1 application topically as needed for itching.     polyethylene glycol packet  Commonly known as:  MIRALAX / GLYCOLAX  Take 17 g by mouth daily.        VVS, Skin clean, dry and intact without evidence of skin break down, no evidence of skin tears noted. IV catheter discontinued intact. Site without signs and symptoms of complications. Dressing and pressure applied.  An After Visit Summary was printed and given to the patient.  D/c education completed with patient/family including follow up instructions, medication list, d/c activities limitations if indicated, with other d/c instructions as indicated by MD - patient able to verbalize understanding, all questions fully answered.   Patient instructed to return to ED, call 911, or call MD for any changes in condition.   Patient escorted via WC, and D/C home via private auto.  Aldean AstLEsperance, [name] C 12/31/2013 12:26 PM

## 2013-12-31 NOTE — Discharge Summary (Signed)
Physician Discharge Summary  Randy SprangJerry A Morrison WUJ:[id number] DOB: 08/16/1950 DOA: 12/29/2013  PCP: Pcp Not In System  Admit date: 12/29/2013 Discharge date: 12/31/2013  Recommendations for Outpatient Follow-up:   1. F/u with primary care doctor in 2 weeks or sooner as needed. Please repeat CBC with differential, and further investigation into normocytic anemia and mild leukopenia if needed.   2. Advised eat a high fiber diet and use MiraLax daily. Advised to stop NSAIDs and use Tylenol as needed for pain.  Discharge Diagnoses:  Active Problems:   CONSTIPATION   ANEMIA, MILD, HX OF   SBO (small bowel obstruction)   Abdominal pain, acute   Leukopenia   Discharge Condition: stable, improved  Diet recommendation:  High fiber  Wt Readings from Last 3 Encounters:  12/29/13 103.874 kg (229 lb)    History of present illness:   The patient is a 63 year old male with no significant past medical history. He presented with severe abdominal pain, nausea, vomiting, and was found to have a mild ileus versus partial bowel obstruction.  Hospital Course:   Partial small bowel obstruction versus ileus:   He did not require NG tube placement. His pain subsided with IV pain medication x2 doses, and his bowel sounds returned quickly. He has been tolerating a soft mechanical diet and passing flatus.   He was advised to use MiraLax and high-fiber diet at home to prevent constipation. He may use bisacodyl or Colace rectally if needed.  He should followup with Doctor Juanda ChanceBrodie in one month since his last colonoscopy was about 10 years ago.   Mild leukopenia and normocytic anemia with normal platelets, may be hemodilution from IV fluids -  Repeat CBC by primary care Doctor a follow up appointment in approximately 2 weeks  Constipation, normal TSH. See above  Procedures:   CT abdomen and pelvis   Consultations:   none   Discharge Exam: Filed Vitals:   12/31/13 0401  BP: 116/77  Pulse: 52  Temp: 97.6 F  (36.4 C)  Resp: 13   Filed Vitals:   12/30/13 0547 12/30/13 1324 12/30/13 2051 12/31/13 0401  BP: 118/74 138/90 122/73 116/77  Pulse: 58 53 64 52  Temp: 98.2 F (36.8 C) 97.5 F (36.4 C) 98.3 F (36.8 C) 97.6 F (36.4 C)  TempSrc: Oral Oral Oral Oral  Resp: 16 18 14 13   Height:      Weight:      SpO2: 97% 100% 98% 98%    General:  Caucasian male, no acute distress  HEENT: Normocephalic atraumatic, moist mucous membranes  Cardiovascular:  regular rate and rhythm, no murmurs, rubs, gallops, 2+ pulses  Respiratory: clear to auscultation bilaterally  Abdomen: Normal active bowel sounds, soft, nondistended, nontender, full feeling in the left quadrants consistent with MSK: No alert or many edema, normal tone and bulk    Discharge Instructions      Discharge Instructions   Call MD for:  difficulty breathing, headache or visual disturbances    Complete by:  As directed      Call MD for:  extreme fatigue    Complete by:  As directed      Call MD for:  hives    Complete by:  As directed      Call MD for:  persistant dizziness or light-headedness    Complete by:  As directed      Call MD for:  persistant nausea and vomiting    Complete by:  As directed  Call MD for:  severe uncontrolled pain    Complete by:  As directed      Call MD for:  temperature >100.4    Complete by:  As directed      Diet general    Complete by:  As directed      Discharge instructions    Complete by:  As directed   You were hospitalized with a small bowel obstruction.  Sometimes using frequent ibuprofen or naprosyn can cause some blockages of the bowel so please use tylenol instead for now.  Eat a high fiber diet and/or use metamucil PLUS use miralax every day to prevent constipation.  Please try to adjust the miralax dose so you have about 2 soft bowel movements per day.  Follow up with Dr. Juanda Chance in 1 month.  You have some very mild anemia and very mildly low white blood cell count which probably  happened because they were diluted by IV fluids.  Have your primary care doctor repeat your blood counts in a few weeks to make sure they are back to normal.     Increase activity slowly    Complete by:  As directed             Medication List    STOP taking these medications       ibuprofen 200 MG tablet  Commonly known as:  ADVIL,MOTRIN      TAKE these medications       BLUE-EMU MAXIMUM STRENGTH EX  Apply 1 application topically as needed (for pain).     hydrocortisone cream 1 %  Apply 1 application topically as needed for itching.     polyethylene glycol packet  Commonly known as:  MIRALAX / GLYCOLAX  Take 17 g by mouth daily.       Follow-up Information   Follow up with Pcp Not In System. Schedule an appointment as soon as possible for a visit in 2 weeks.      Follow up with [name], MD. Schedule an appointment as soon as possible for a visit in 1 month.   Specialty:  Gastroenterology   Contact information:   [address] [place] [address] [phone]        The results of significant diagnostics from this hospitalization (including imaging, microbiology, ancillary and laboratory) are listed below for reference.    Significant Diagnostic Studies: Ct Abdomen Pelvis W Contrast  12/29/2013   CLINICAL DATA:  Generalized abdominal pain with nausea and vomiting  EXAM: CT ABDOMEN AND PELVIS WITH CONTRAST  TECHNIQUE: Multidetector CT imaging of the abdomen and pelvis was performed using the standard protocol following bolus administration of intravenous contrast.  CONTRAST:  OMNIPAQUE IOHEXOL 300 MG/ML SOLN intravenously ; the patient did not receive oral contrast material.  COMPARISON:  Acute abdominal series of today's date  FINDINGS: The stomach contains a small to moderate volume of fluid but is not abnormally distended. The duodenum is normal. There are loops of mildly distended fluid filled mid small bowel in the midline and to the right. A  transition zone is suspected just to the left of midline on images 44 through 65. Mildly increased density in the mesenteric fat here suggests inflammation. Distal to this the small bowel caliber is normal. No intussusception is visible. The terminal ileum is unremarkable. The appendix is normal. There is a normal stool and gas pattern within the colon. There is no umbilical hernia. There is a moderate-sized inguinal hernia on the left  containing fat and a smaller fat containing right inguinal hernia. No definite internal hernia is demonstrated.  The liver, gallbladder, spleen, pancreas, adrenal glands, and kidneys are normal. The abdominal aorta is normal. The urinary bladder and prostate gland are normal. There is a small amount of free fluid in the pelvis.  There is degenerative disc change at L4-5 and L5-S1. The bony pelvis is unremarkable. There is bibasilar compressive atelectasis.  IMPRESSION: 1. The bowel gas pattern is consistent with a mid small bowel ileus or partial obstruction. This may be related to adhesions but reportedly the patient has had no abdominal surgery. The terminal ileum, appendix, and colon are unremarkable. 2. There is a small amount of free pelvic fluid but no free extraluminal gas. There is no intra-abdominal abscess. 3. There is a large left inguinal hernia and small right inguinal hernia. Both contain fat but no bowel.   Electronically Signed   By: David  [place]   On: 12/29/2013 20:35   Dg Abd 2 Views  12/31/2013   CLINICAL DATA:  Evaluate small bowel obstruction  EXAM: ABDOMEN - 2 VIEW  COMPARISON:  12/29/2013; CT abdomen pelvis- 12/29/2013  FINDINGS: Paucity of bowel gas without definite evidence of obstruction. Moderate colonic stool burden. No pneumoperitoneum, pneumatosis or portal venous gas. No definite abnormal intra-abdominal calcifications.  Limited visualization of lower thorax demonstrates minimal bibasilar heterogeneous opacities, left greater right.  No acute osseus  abnormalities.  IMPRESSION: 1. Paucity of bowel gas without definite evidence of obstruction. 2. Moderate colonic stool burden. 3. Bibasilar heterogeneous opacities, left greater than right, possibly atelectasis, though incompletely evaluated on the present examination. Further evaluation with dedicated chest radiograph could be performed as clinically indicated.   Electronically Signed   By: Simonne Come M.D.   On: 12/31/2013 08:21   Dg Abd Acute W/chest  12/29/2013   CLINICAL DATA:  Stomach pain with nausea and vomiting  EXAM: ACUTE ABDOMEN SERIES (ABDOMEN 2 VIEW & CHEST 1 VIEW)  COMPARISON:  None.  FINDINGS: The lungs are clear. The heart and mediastinal structures are normal. Within the abdomen there small bowel air-fluid levels. There is a moderate stool burden in the colon without evidence of obstruction. There are phleboliths in the pelvis. The bony thorax and the lumbar spine and visualized portions of the pelvis are normal.  IMPRESSION: The bowel gas pattern is consistent with an ileus or mid to distal partial small bowel obstruction. There is no evidence of perforation. There is no acute cardiopulmonary abnormality.   Electronically Signed   By: David  [place]   On: 12/29/2013 18:49    Microbiology: No results found for this or any previous visit (from the past 240 hour(s)).   Labs: Basic Metabolic Panel:  Recent Labs Lab 12/29/13 1429 12/30/13 0745 12/31/13 0505  NA 141 140 141  K 3.9 4.0 4.0  CL 102 103 104  CO2 25 23 25   GLUCOSE 129* 105* 113*  BUN 8 10 8   CREATININE 1.02 1.08 1.05  CALCIUM 9.6 8.5 8.6   Liver Function Tests:  Recent Labs Lab 12/29/13 1429  AST 29  ALT 27  ALKPHOS 67  BILITOT 0.4  PROT 8.1  ALBUMIN 4.6    Recent Labs Lab 12/29/13 1429  LIPASE 26   No results found for this basename: AMMONIA,  in the last 168 hours CBC:  Recent Labs Lab 12/29/13 1429 12/30/13 0745 12/31/13 0505  WBC 9.1 4.7 3.4*  NEUTROABS 8.0*  --   --   HGB 14.7  11.9*  11.7*  HCT 43.2 35.5* 35.2*  MCV 87.6 87.0 87.6  PLT 212 184 181   Cardiac Enzymes: No results found for this basename: CKTOTAL, CKMB, CKMBINDEX, TROPONINI,  in the last 168 hours BNP: BNP (last 3 results) No results found for this basename: PROBNP,  in the last 8760 hours CBG:  Recent Labs Lab 12/30/13 0757  GLUCAP 95    Time coordinating discharge: 35 minutes  Signed:  Horace Wishon  [facility] 12/31/2013, 9:43 AM

## 2014-01-27 ENCOUNTER — Telehealth: Payer: Self-pay | Admitting: Internal Medicine

## 2014-01-27 ENCOUNTER — Encounter: Payer: Self-pay | Admitting: *Deleted

## 2014-01-27 NOTE — Telephone Encounter (Signed)
Spoke with patient's wife and he was hospitalized 3 weeks ago and was told to f/u with his GI MD. Scheduled patient on 02/21/14 at 9:00 AM with Dr. Juanda ChanceBrodie.

## 2014-02-21 ENCOUNTER — Ambulatory Visit: Payer: Medicare HMO | Admitting: Internal Medicine

## 2014-02-24 ENCOUNTER — Ambulatory Visit (INDEPENDENT_AMBULATORY_CARE_PROVIDER_SITE_OTHER): Payer: Commercial Managed Care - HMO | Admitting: Internal Medicine

## 2014-02-24 ENCOUNTER — Encounter: Payer: Self-pay | Admitting: Internal Medicine

## 2014-02-24 VITALS — BP 134/100 | HR 80 | Ht 73.0 in | Wt 226.0 lb

## 2014-02-24 DIAGNOSIS — K56609 Unspecified intestinal obstruction, unspecified as to partial versus complete obstruction: Secondary | ICD-10-CM

## 2014-02-24 NOTE — Progress Notes (Signed)
Randy Morrison June 05, 1951 [id number]  Note: This dictation was prepared with Dragon digital system. Any transcriptional errors that result from this procedure are unintentional.   History of Present Illness: This is a 63 year old white male recently  hospitalization for suspected small bowel obstruction after he presented on 12/29/2013 with severe peri umbilical pain, nausea and vomiting of bilious material. He has never had a similar episode before.. CT scan of the abdomen showed left inguinal hernia without incarceration.. Mildly distended loop of the small bowel with question of a transition zone to the left of the midline in the small bowel. We have done colonoscopy  in April 2003 for colorectal screening. This was a normal exam with the exception of hemorrhoids. He was discharged from the hospital on 12/31/2013 and has done well. He denies n&v but  he had  vague discomfort early today. He's been able to resume regular diet.The inguinal hernia does not bother him unless he is very active physically.    Past Medical History  Diagnosis Date  . Hemorrhoids   . Inguinal hernia   . Small bowel obstruction   . Anemia     Past Surgical History  Procedure Laterality Date  . Tonsillectomy      No Known Allergies  Family history and social history have been reviewed.  Review of Systems:   The remainder of the 10 point ROS is negative except as outlined in the H&P  Physical Exam: General Appearance Well developed, in no distress Eyes  Non icteric  HEENT  Non traumatic, normocephalic  Mouth No lesion, tongue papillated, no cheilosis Neck Supple without adenopathy, thyroid not enlarged, no carotid bruits, no JVD Lungs Clear to auscultation bilaterally COR Normal S1, normal S2, regular rhythm, no murmur, quiet precordium Abdomen soft nontender with normoactive bowel sounds. I cannot appreciate left inguinal hernia in supine position. as noted on CT scan of the abdomen, liver edge at costal  margin. No tympany Rectal not done Extremities  No pedal edema Skin No lesions Neurological Alert and oriented x 3 Psychological Normal mood and affect  Assessment and Plan:   63 year old, white male with recent hospitalization for suspected small bowel obstruction which resolved on bowel rest. Nasogastric suction was not used. Patient has done well. We will go ahead and schedule small bowel follow-through to look for small bowel malrotation or some structural changes which could predispose him to recurrent attacks.  Colorectal screening. Last colonoscopy 2003. He is due for recall. We will send him a recall letter in November 2015    [name] 02/24/2014

## 2014-02-24 NOTE — Patient Instructions (Addendum)
Dr Lesle Chris have been scheduled for a small bowel follow thru at [facility] on 03/02/2014 at 10:30am. Please arrive 15 minutes early and nothing to eat or drink after midnight   The Small Bowel Follow Thru examination is used to visualize the entire small bowel (intestines); specifically the connection between the small and large intestine. You will be positioned on a flat x-ray table and an image of your abdomen taken. Then the technologist will show the x-ray to the radiologist. The radiologist will instruct your technologist how much (1-2 cups) barium sulfate you will drink and when to begin taking the timed x-rays, usually 15-30 minutes after you begin drinking. Barium is a harmless substance that will highlight your small intestine by absorbing x-ray. The taste is chalky and it feels very heavy both in the cup and in your stomach.  After the first x-ray is taken and shown to the radiologist, he/she will determine when the next image is to be taken. This is repeated until the barium has reached the end of the small intestine and enters the beginning of the colon (cecum). At such time when the barium spills into the colon, you will be positioned on the x-ray table once again. The radiologist will use a fluoroscopic camera to take some detailed pictures of the connection between your small intestine and colon. The fluoroscope is an x-ray unit that works with a television/computer screen. The radiologist will apply pressure to your abdomen with his/her hand and a lead glove, a plastic paddle, or a paddle with an inflated rubber balloon on the end. This is to spread apart your loops of intestine so he/she can see all areas.   This test typically takes around 1 hour to complete.  **Important** Drink plenty of water (8-10 cups/day) for a few days following the procedure to avoid constipation and blockage. The barium will make your stools white for a few  days. ____________________________________________________________________

## 2014-03-02 ENCOUNTER — Ambulatory Visit (HOSPITAL_COMMUNITY)
Admission: RE | Admit: 2014-03-02 | Discharge: 2014-03-02 | Disposition: A | Discharge status: Home / Self Care | Payer: Medicare HMO | Source: Ambulatory Visit | Attending: Internal Medicine | Admitting: Internal Medicine

## 2014-03-02 DIAGNOSIS — R112 Nausea with vomiting, unspecified: Secondary | ICD-10-CM | POA: Insufficient documentation

## 2014-03-02 DIAGNOSIS — K56609 Unspecified intestinal obstruction, unspecified as to partial versus complete obstruction: Secondary | ICD-10-CM | POA: Diagnosis not present

## 2014-03-02 DIAGNOSIS — Q433 Congenital malformations of intestinal fixation: Secondary | ICD-10-CM | POA: Diagnosis not present

## 2014-03-02 DIAGNOSIS — R1033 Periumbilical pain: Secondary | ICD-10-CM | POA: Diagnosis not present

## 2014-03-06 ENCOUNTER — Other Ambulatory Visit: Payer: Self-pay | Admitting: *Deleted

## 2014-03-06 ENCOUNTER — Encounter: Payer: Self-pay | Admitting: Internal Medicine

## 2014-03-06 DIAGNOSIS — R933 Abnormal findings on diagnostic imaging of other parts of digestive tract: Secondary | ICD-10-CM

## 2014-03-21 ENCOUNTER — Other Ambulatory Visit (INDEPENDENT_AMBULATORY_CARE_PROVIDER_SITE_OTHER): Payer: Self-pay | Admitting: Surgery

## 2014-09-26 ENCOUNTER — Encounter: Payer: Self-pay | Admitting: Internal Medicine

## 2014-10-31 ENCOUNTER — Encounter: Payer: Self-pay | Admitting: Internal Medicine

## 2014-12-18 ENCOUNTER — Other Ambulatory Visit: Payer: Self-pay

## 2014-12-20 ENCOUNTER — Encounter: Payer: Medicare HMO | Admitting: Internal Medicine

## 2015-07-10 ENCOUNTER — Encounter: Payer: Self-pay | Admitting: Gastroenterology

## 2015-08-07 DIAGNOSIS — J988 Other specified respiratory disorders: Secondary | ICD-10-CM | POA: Diagnosis not present

## 2015-08-28 ENCOUNTER — Ambulatory Visit (AMBULATORY_SURGERY_CENTER): Payer: Self-pay | Admitting: *Deleted

## 2015-08-28 VITALS — Ht 74.0 in | Wt 218.0 lb

## 2015-08-28 DIAGNOSIS — Z1211 Encounter for screening for malignant neoplasm of colon: Secondary | ICD-10-CM

## 2015-08-28 MED ORDER — NA SULFATE-K SULFATE-MG SULF 17.5-3.13-1.6 GM/177ML PO SOLN: 1.0000 | Freq: Once | ORAL | Status: DC

## 2015-08-28 NOTE — Progress Notes (Signed)
No egg or soy allergy known to patient  No issues with past sedation with any surgeries  or procedures, no intubation problems  No diet pills per patient No home 02 use per patient  No blood thinners per patient  emmi declined  

## 2015-09-04 ENCOUNTER — Ambulatory Visit (AMBULATORY_SURGERY_CENTER): Payer: PPO | Admitting: Gastroenterology

## 2015-09-04 ENCOUNTER — Encounter: Payer: Self-pay | Admitting: Gastroenterology

## 2015-09-04 VITALS — BP 150/95 | HR 77 | Temp 99.6°F | Resp 16 | Ht 74.0 in | Wt 218.0 lb

## 2015-09-04 DIAGNOSIS — Z538 Procedure and treatment not carried out for other reasons: Secondary | ICD-10-CM | POA: Diagnosis not present

## 2015-09-04 DIAGNOSIS — Z1211 Encounter for screening for malignant neoplasm of colon: Secondary | ICD-10-CM | POA: Diagnosis not present

## 2015-09-04 DIAGNOSIS — K648 Other hemorrhoids: Secondary | ICD-10-CM | POA: Diagnosis not present

## 2015-09-04 DIAGNOSIS — Z5309 Procedure and treatment not carried out because of other contraindication: Secondary | ICD-10-CM | POA: Diagnosis not present

## 2015-09-04 DIAGNOSIS — K644 Residual hemorrhoidal skin tags: Secondary | ICD-10-CM | POA: Diagnosis not present

## 2015-09-04 MED ORDER — SODIUM CHLORIDE 0.9 % IV SOLN: 500.0000 mL | INTRAVENOUS | Status: DC

## 2015-09-04 NOTE — Patient Instructions (Signed)
YOU HAD AN ENDOSCOPIC PROCEDURE TODAY AT THE [facility]:   Refer to the procedure report that was given to you for any specific questions about what was found during the examination.  If the procedure report does not answer your questions, please call your gastroenterologist to clarify.  If you requested that your care partner not be given the details of your procedure findings, then the procedure report has been included in a sealed envelope for you to review at your convenience later.  YOU SHOULD EXPECT: Some feelings of bloating in the abdomen. Passage of more gas than usual.  Walking can help get rid of the air that was put into your GI tract during the procedure and reduce the bloating. If you had a lower endoscopy (such as a colonoscopy or flexible sigmoidoscopy) you may notice spotting of blood in your stool or on the toilet paper. If you underwent a bowel prep for your procedure, you may not have a normal bowel movement for a few days.  Please Note:  You might notice some irritation and congestion in your nose or some drainage.  This is from the oxygen used during your procedure.  There is no need for concern and it should clear up in a day or so.  SYMPTOMS TO REPORT IMMEDIATELY:   Following lower endoscopy (colonoscopy or flexible sigmoidoscopy):  Excessive amounts of blood in the stool  Significant tenderness or worsening of abdominal pains  Swelling of the abdomen that is new, acute  Fever of 100F or higher   For urgent or emergent issues, a gastroenterologist can be reached at any hour by calling [phone].   DIET: Your first meal following the procedure should be a small meal and then it is ok to progress to your normal diet. Heavy or fried foods are harder to digest and may make you feel nauseous or bloated.  Likewise, meals heavy in dairy and vegetables can increase bloating.  Drink plenty of fluids but you should avoid alcoholic beverages for 24  hours.  ACTIVITY:  You should plan to take it easy for the rest of today and you should NOT DRIVE or use heavy machinery until tomorrow (because of the sedation medicines used during the test).    FOLLOW UP: Our staff will call the number listed on your records the next business day following your procedure to check on you and address any questions or concerns that you may have regarding the information given to you following your procedure. If we do not reach you, we will leave a message.  However, if you are feeling well and you are not experiencing any problems, there is no need to return our call.  We will assume that you have returned to your regular daily activities without incident.  If any biopsies were taken you will be contacted by phone or by letter within the next 1-3 weeks.  Please call us at [phone] if you have not heard about the biopsies in 3 weeks.    SIGNATURES/CONFIDENTIALITY: You and/or your care partner have signed paperwork which will be entered into your electronic medical record.  These signatures attest to the fact that that the information above on your After Visit Summary has been reviewed and is understood.  Full responsibility of the confidentiality of this discharge information lies with you and/or your care-partner.   Resume medications. 

## 2015-09-04 NOTE — Progress Notes (Signed)
Patient's initial BP 165/111. Patient asymptomatic, denies chest pain, shortness of breath  Or headache. Patient recovering from URI stating he had had x 6 weeks, much improved this week.Rechecked manual BP on left, 160/110. Spoke with Dr Lavon PaganiniNandigam and Cathlyn ParsonsJohn Nulty CRNA, cleared for procedure.

## 2015-09-04 NOTE — Op Note (Signed)
[facility] Patient Name: Randy Morrison Procedure Date: 09/04/2015 12:07 PM MRN: [id number] Endoscopist: Napoleon Form , MD Age: 65 Referring MD:  Date of Birth: 31-Jan-1951 Gender: Male Procedure:                Colonoscopy Indications:              Screening for colorectal malignant neoplasm Medicines:                Monitored Anesthesia Care Procedure:                Pre-Anesthesia Assessment:                           - Prior to the procedure, a History and Physical                            was performed, and patient medications and                            allergies were reviewed. The patient's tolerance of                            previous anesthesia was also reviewed. The risks                            and benefits of the procedure and the sedation                            options and risks were discussed with the patient.                            All questions were answered, and informed consent                            was obtained. Prior Anticoagulants: The patient has                            taken no previous anticoagulant or antiplatelet                            agents. ASA Grade Assessment: II - A patient with                            mild systemic disease. After reviewing the risks                            and benefits, the patient was deemed in                            satisfactory condition to undergo the procedure.                           After obtaining informed consent, the colonoscope  was passed under direct vision. Throughout the                            procedure, the patient's blood pressure, pulse, and                            oxygen saturations were monitored continuously. The                            Model CF-[id number]) scope was introduced                            through the anus with the intention of advancing to                            the splenic flexure. The scope was  advanced to the                            sigmoid colon before the procedure was aborted.                            Medications were given. The colonoscopy was                            performed without difficulty. The patient tolerated                            the procedure well. The quality of the bowel                            preparation was poor. The colonoscopy was somewhat                            difficult due to poor bowel prep with stool                            present. The rectum was photographed. Scope In: 12:15:39 PM Scope Out: 12:17:56 PM Total Procedure Duration: 0 hours 2 minutes 17 seconds  Findings:      The perianal exam findings include non-thrombosed external hemorrhoids       and thrombosed internal hemorrhoids.      Poor prep Procedure was aborted Complications:            No immediate complications. Estimated Blood Loss:     Estimated blood loss: none. Impression:               - Preparation of the colon was poor.                           - Non-thrombosed external hemorrhoids and                            thrombosed internal hemorrhoids found on perianal  exam.                           - No specimens collected. Recommendation:           - Resume previous diet.                           - Continue present medications.                           - Repeat colonoscopy at the next available                            appointment because the bowel preparation was poor. Procedure Code(s):        --- Professional ---                           Z6109, 53, Colorectal cancer screening; colonoscopy                            on individual not meeting criteria for high risk CPT copyright 2016 [employer]. All rights reserved. Napoleon Form, MD 09/04/2015 12:23:59 PM This report has been signed electronically. Number of Addenda: 0

## 2015-09-04 NOTE — Progress Notes (Signed)
A/ox3 pleased with MAC, report to Suzanne RN 

## 2015-09-05 ENCOUNTER — Telehealth: Payer: Self-pay

## 2015-09-05 NOTE — Telephone Encounter (Signed)
  Follow up Call-  Call back number 09/04/2015  Post procedure Call Back phone  # [phone]  Permission to leave phone message Yes     Patient questions:  Do you have a fever, pain , or abdominal swelling? No. Pain Score  0 *  Have you tolerated food without any problems? Yes.    Have you been able to return to your normal activities? Yes.    Do you have any questions about your discharge instructions: Diet   No. Medications  No. Follow up visit  No.  Do you have questions or concerns about your Care? No.  Actions: * If pain score is 4 or above: No action needed, pain <4.

## 2015-09-12 ENCOUNTER — Other Ambulatory Visit (INDEPENDENT_AMBULATORY_CARE_PROVIDER_SITE_OTHER): Payer: PPO

## 2015-09-12 ENCOUNTER — Encounter: Payer: Self-pay | Admitting: Internal Medicine

## 2015-09-12 ENCOUNTER — Ambulatory Visit (INDEPENDENT_AMBULATORY_CARE_PROVIDER_SITE_OTHER): Payer: PPO | Admitting: Internal Medicine

## 2015-09-12 VITALS — BP 158/110 | HR 71 | Temp 97.9°F | Resp 16 | Ht 74.0 in | Wt 217.0 lb

## 2015-09-12 DIAGNOSIS — R030 Elevated blood-pressure reading, without diagnosis of hypertension: Secondary | ICD-10-CM | POA: Diagnosis not present

## 2015-09-12 DIAGNOSIS — Z0000 Encounter for general adult medical examination without abnormal findings: Secondary | ICD-10-CM | POA: Diagnosis not present

## 2015-09-12 DIAGNOSIS — Z125 Encounter for screening for malignant neoplasm of prostate: Secondary | ICD-10-CM | POA: Diagnosis not present

## 2015-09-12 DIAGNOSIS — Z139 Encounter for screening, unspecified: Secondary | ICD-10-CM | POA: Diagnosis not present

## 2015-09-12 LAB — CBC WITH DIFFERENTIAL/PLATELET
Basophils Absolute: 0 10*3/uL (ref 0.0–0.1)
Basophils Relative: 1 % (ref 0.0–3.0)
EOS PCT: 2.2 % (ref 0.0–5.0)
Eosinophils Absolute: 0.1 10*3/uL (ref 0.0–0.7)
HCT: 41.8 % (ref 39.0–52.0)
Hemoglobin: 14.3 g/dL (ref 13.0–17.0)
LYMPHS ABS: 1 10*3/uL (ref 0.7–4.0)
Lymphocytes Relative: 34.8 % (ref 12.0–46.0)
MCHC: 34.1 g/dL (ref 30.0–36.0)
MCV: 89.4 fl (ref 78.0–100.0)
MONOS PCT: 11 % (ref 3.0–12.0)
Monocytes Absolute: 0.3 10*3/uL (ref 0.1–1.0)
NEUTROS ABS: 1.5 10*3/uL (ref 1.4–7.7)
NEUTROS PCT: 51 % (ref 43.0–77.0)
PLATELETS: 192 10*3/uL (ref 150.0–400.0)
RBC: 4.68 Mil/uL (ref 4.22–5.81)
RDW: 13.4 % (ref 11.5–15.5)
WBC: 2.9 10*3/uL — ABNORMAL LOW (ref 4.0–10.5)

## 2015-09-12 LAB — COMPREHENSIVE METABOLIC PANEL
ALT: 16 U/L (ref 0–53)
AST: 23 U/L (ref 0–37)
Albumin: 4.1 g/dL (ref 3.5–5.2)
Alkaline Phosphatase: 57 U/L (ref 39–117)
BUN: 7 mg/dL (ref 6–23)
CHLORIDE: 99 meq/L (ref 96–112)
CO2: 29 meq/L (ref 19–32)
Calcium: 9.4 mg/dL (ref 8.4–10.5)
Creatinine, Ser: 1.02 mg/dL (ref 0.40–1.50)
GFR: 77.46 mL/min (ref >60.00)
GLUCOSE: 113 mg/dL — AB (ref 70–99)
POTASSIUM: 4.4 meq/L (ref 3.5–5.1)
SODIUM: 133 meq/L — AB (ref 135–145)
Total Bilirubin: 0.7 mg/dL (ref 0.2–1.2)
Total Protein: 7.3 g/dL (ref 6.0–8.3)

## 2015-09-12 LAB — LIPID PANEL
CHOL/HDL RATIO: 3
Cholesterol: 159 mg/dL (ref 0–200)
HDL: 57.6 mg/dL (ref >39.00)
LDL CALC: 82 mg/dL (ref 0–99)
NONHDL: 100.94
TRIGLYCERIDES: 93 mg/dL (ref 0.0–149.0)
VLDL: 18.6 mg/dL (ref 0.0–40.0)

## 2015-09-12 LAB — TSH: TSH: 1.26 u[IU]/mL (ref 0.35–4.50)

## 2015-09-12 LAB — HEMOGLOBIN A1C: Hgb A1c MFr Bld: 6.1 % (ref 4.6–6.5)

## 2015-09-12 NOTE — Patient Instructions (Signed)
   Test(s) ordered today. Your results will be released to MyChart (or called to you) after review, usually within 72hours after test completion. If any changes need to be made, you will be notified at that same time.  All other Health Maintenance issues reviewed.   All recommended immunizations and age-appropriate screenings are up-to-date.  No immunizations administered today.   Medications reviewed and updated.  No changes recommended at this time.   Please followup annually    

## 2015-09-12 NOTE — Progress Notes (Signed)
Pre visit review using our clinic review tool, if applicable. No additional management support is needed unless otherwise documented below in the visit note. 

## 2015-09-12 NOTE — Progress Notes (Signed)
Subjective:    Patient ID: Randy Morrison, male    DOB: 11/18/1950, 65 y.o.   MRN: [id number]  HPI  He is here to establish with a new pcp.     He has no concerns.  He has some constipation and does not feel like his bowels are moving like they should.  He has seen GI and had a recent Colonoscopy. His prep was not adequate and he needs to have repeat colonoscopy next month. He is doing everything they advised. He has had a inguinal hernia repair and plans on following up with surgery to make sure that is okay. He denies any abdominal pain.  He typically exercises regularly, but has not been exercising the past several weeks he has had an upper respiratory infection.  Medications and allergies reviewed with patient and updated if appropriate.  Patient Active Problem List   Diagnosis Date Noted  . SBO (small bowel obstruction) (HCC) 12/29/2013  . HEMORRHOIDS 01/17/2010  . CONSTIPATION 01/17/2010    Current Outpatient Prescriptions on File Prior to Visit  Medication Sig Dispense Refill  . aspirin EC 81 MG tablet Take 81 mg by mouth daily.     No current facility-administered medications on file prior to visit.    Past Medical History  Diagnosis Date  . Hemorrhoids   . Inguinal hernia   . Small bowel obstruction (HCC)   . Anemia     Past Surgical History  Procedure Laterality Date  . Tonsillectomy    . Umbilical hernia repair      with mesh   . Colonoscopy  09-2001    Social History   Social History  . Marital Status: Married    Spouse Name: Kendal HymenBonnie  . Number of Children: 2  . Years of Education: N/A   Occupational History  . Retired    Social History Main Topics  . Smoking status: Never Smoker   . Smokeless tobacco: Never Used  . Alcohol Use: 0.0 oz/week    0 Standard drinks or equivalent per week     Comment: Occ  . Drug Use: No  . Sexual Activity: Not Asked   Other Topics Concern  . None   Social History Narrative   Lives with wife, has 2 children, 2  granddaughters      Exercises regularly - walks    Family History  Problem Relation Age of Onset  . Colon cancer Neg Hx   . Colon polyps Neg Hx   . Esophageal cancer Neg Hx   . Rectal cancer Neg Hx   . Stomach cancer Neg Hx   . Diabetes Neg Hx   . Heart disease Neg Hx     Review of Systems  Constitutional: Negative for fever, chills, appetite change, fatigue and unexpected weight change.  HENT: Negative for hearing loss.   Eyes: Negative for visual disturbance.  Respiratory: Negative for cough, shortness of breath and wheezing.   Cardiovascular: Negative for chest pain, palpitations and leg swelling.  Gastrointestinal: Positive for constipation. Negative for abdominal pain and blood in stool.       No gerd  Genitourinary: Negative for dysuria and hematuria.  Musculoskeletal: Negative for back pain and arthralgias.  Skin: Negative for color change and rash.  Neurological: Negative for dizziness, weakness, light-headedness, numbness and headaches.  Psychiatric/Behavioral: Negative for sleep disturbance and dysphoric mood. The patient is not nervous/anxious.        Objective:   Filed Vitals:   09/12/15 0828  BP:  158/110  Pulse: 71  Temp: 97.9 F (36.6 C)  Resp: 16   Filed Weights   09/12/15 0828  Weight: 217 lb (98.431 kg)   Body mass index is 27.85 kg/(m^2).   Physical Exam Constitutional: He appears well-developed and well-nourished. No distress.  HENT:  Head: Normocephalic and atraumatic.  Right Ear: External ear normal.  Left Ear: External ear normal.  Mouth/Throat: Oropharynx is clear and moist.  Normal ear canals and TM b/l  Eyes: Conjunctivae and EOM are normal.  Neck: Neck supple. No tracheal deviation present. No thyromegaly present.  No carotid bruit  Cardiovascular: Normal rate, regular rhythm, normal heart sounds and intact distal pulses.   No murmur heard. Pulmonary/Chest: Effort normal and breath sounds normal. No respiratory distress. He has no  wheezes. He has no rales.  Abdominal: Soft. Bowel sounds are normal. He exhibits no distension. There is no tenderness.  Genitourinary: deferred  Musculoskeletal: He exhibits no edema.  Lymphadenopathy:    He has no cervical adenopathy.  Skin: Skin is warm and dry. He is not diaphoretic.  Psychiatric: He has a normal mood and affect. His behavior is normal.       Assessment & Plan:   Physical exam: Screening blood work-ordered Immunizations - deferred vaccine today Colonoscopy first attempt was not successful, second colonoscopy planned for next month Eye exams he will schedule Exercise-typically exercises regularly, but has not been doing it since he has had this upper respiratory infection-plans on restarting Weight - reasonable, continue healthy eating and regular exercise Skin -no concerns Substance abuse-no evidence of substance abuse  Elevated blood pressure When he goes to the doctor's blood pressure tends to be elevated He has done 30 day blood pressure check and his BP is controlled at home - he checks perioidically and will start checking more regularly.    Overall healthy He will be having a repeat colonoscopy next month He will follow-up with surgery regarding repair of his inguinal hernia 2 years ago We'll follow up annually, sooner if needed

## 2015-09-13 ENCOUNTER — Encounter: Payer: Self-pay | Admitting: Internal Medicine

## 2015-09-13 DIAGNOSIS — R7303 Prediabetes: Secondary | ICD-10-CM | POA: Insufficient documentation

## 2015-09-13 LAB — PSA, TOTAL AND FREE
PSA, Free Pct: 24 % (ref >25)
PSA, Free: 0.53 ng/mL
PSA: 2.21 ng/mL (ref <=4.00)

## 2015-10-16 ENCOUNTER — Encounter: Payer: PPO | Admitting: Gastroenterology

## 2015-10-23 ENCOUNTER — Other Ambulatory Visit: Payer: Self-pay | Admitting: Surgery

## 2015-10-23 DIAGNOSIS — K5909 Other constipation: Secondary | ICD-10-CM | POA: Diagnosis not present

## 2015-10-23 DIAGNOSIS — R1030 Lower abdominal pain, unspecified: Secondary | ICD-10-CM | POA: Diagnosis not present

## 2015-10-23 DIAGNOSIS — N509 Disorder of male genital organs, unspecified: Secondary | ICD-10-CM | POA: Diagnosis not present

## 2015-12-04 ENCOUNTER — Encounter: Payer: PPO | Admitting: Gastroenterology

## 2016-07-02 DIAGNOSIS — N4340 Spermatocele of epididymis, unspecified: Secondary | ICD-10-CM | POA: Diagnosis not present

## 2020-01-26 ENCOUNTER — Other Ambulatory Visit (HOSPITAL_COMMUNITY): Payer: Self-pay | Admitting: Family Medicine

## 2020-01-26 ENCOUNTER — Other Ambulatory Visit: Payer: Self-pay | Admitting: Family Medicine

## 2020-01-26 DIAGNOSIS — L729 Follicular cyst of the skin and subcutaneous tissue, unspecified: Secondary | ICD-10-CM

## 2020-02-08 ENCOUNTER — Other Ambulatory Visit: Payer: Self-pay

## 2020-02-08 ENCOUNTER — Ambulatory Visit
Admission: RE | Admit: 2020-02-08 | Discharge: 2020-02-08 | Disposition: A | Discharge status: Home / Self Care | Payer: No Typology Code available for payment source | Source: Ambulatory Visit | Attending: Family Medicine | Admitting: Family Medicine

## 2020-02-08 DIAGNOSIS — L729 Follicular cyst of the skin and subcutaneous tissue, unspecified: Secondary | ICD-10-CM | POA: Insufficient documentation

## 2020-02-08 DIAGNOSIS — N503 Cyst of epididymis: Secondary | ICD-10-CM | POA: Diagnosis not present

## 2021-01-02 DIAGNOSIS — H40023 Open angle with borderline findings, high risk, bilateral: Secondary | ICD-10-CM | POA: Diagnosis not present

## 2021-01-02 DIAGNOSIS — H5203 Hypermetropia, bilateral: Secondary | ICD-10-CM | POA: Diagnosis not present

## 2021-01-02 DIAGNOSIS — H2513 Age-related nuclear cataract, bilateral: Secondary | ICD-10-CM | POA: Diagnosis not present

## 2021-01-02 DIAGNOSIS — H1045 Other chronic allergic conjunctivitis: Secondary | ICD-10-CM | POA: Diagnosis not present

## 2021-01-02 DIAGNOSIS — H35033 Hypertensive retinopathy, bilateral: Secondary | ICD-10-CM | POA: Diagnosis not present

## 2021-06-29 IMAGING — US US SCROTUM W/ DOPPLER COMPLETE
1 series · 15 of 25 positions shown · non-contrast
Comparison: 07/02/2016

CLINICAL DATA: Bilateral epididymal cysts

EXAM:
SCROTAL ULTRASOUND
DOPPLER ULTRASOUND OF THE TESTICLES
TECHNIQUE: Complete ultrasound examination of the testicles, epididymis, and
other scrotal structures was performed. Color and spectral Doppler
ultrasound were also utilized to evaluate blood flow to the
testicles.

[Series 1: us scrotum w/ doppler complete · 45 acquisitions, 15 frames shown]
[im 1/45]
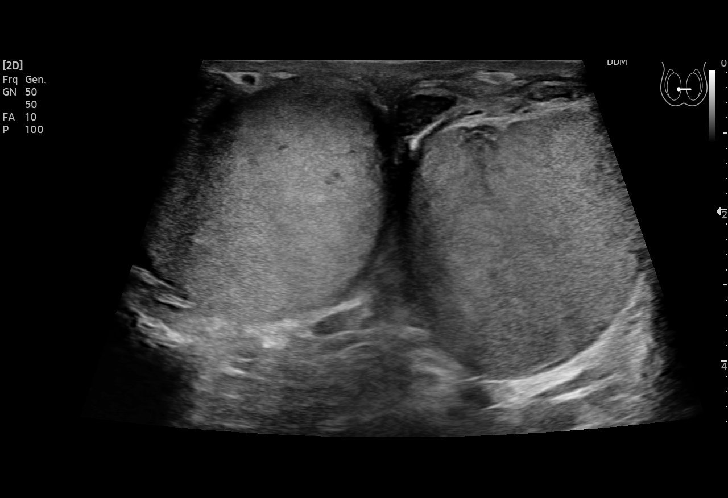
[im 4/45]
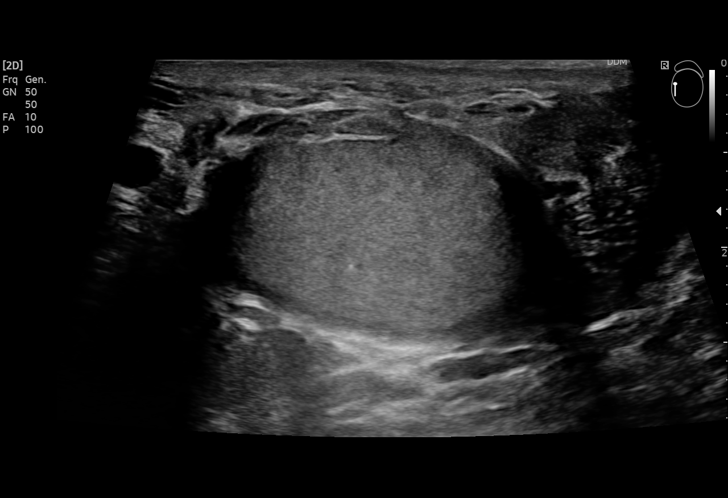
[im 8/45]
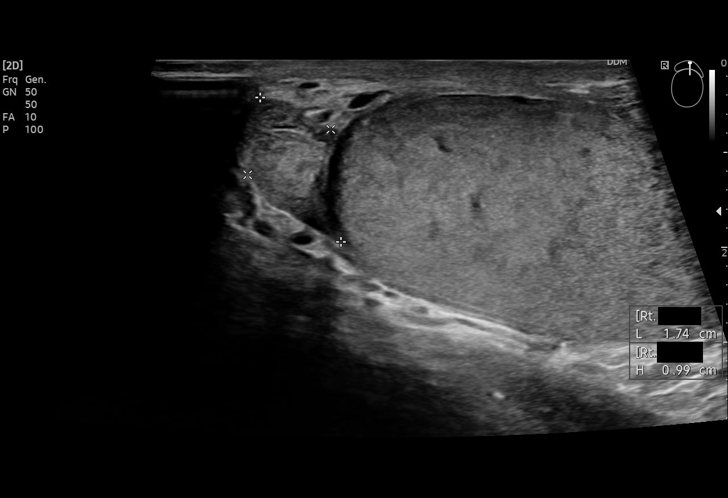
[im 10/45]
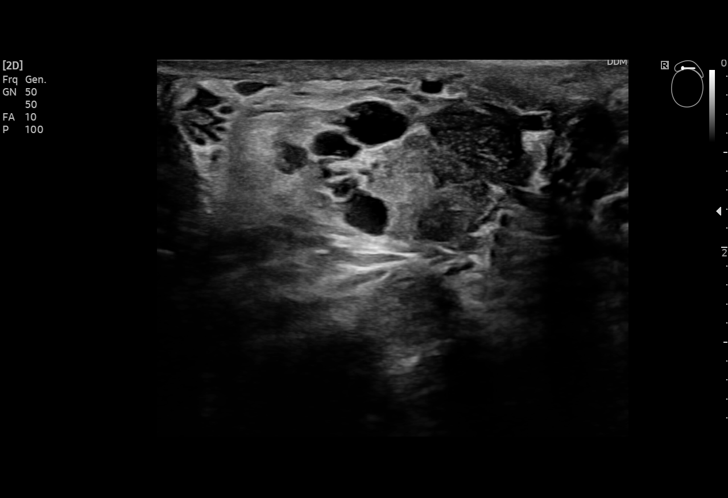
[im 13/45]
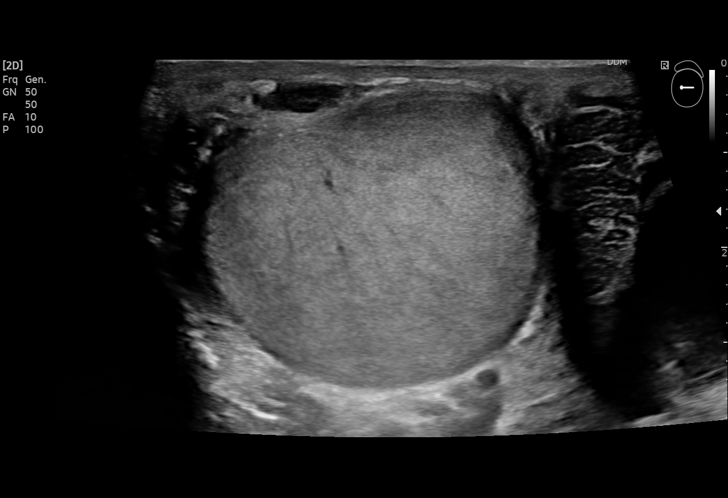
[im 17/45]
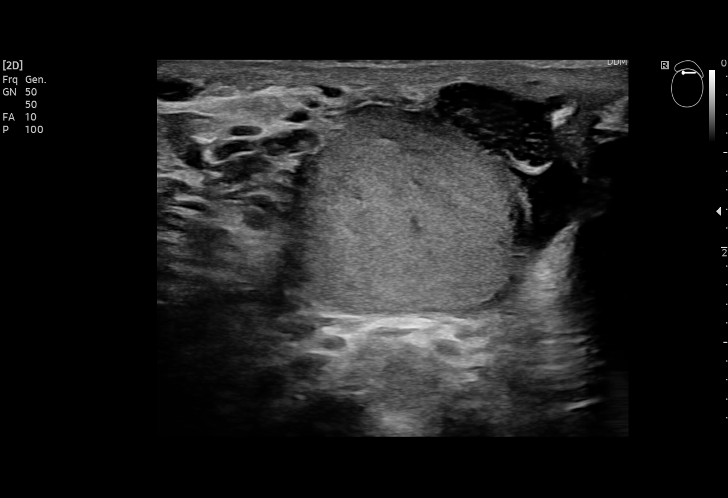
[im 19/45]
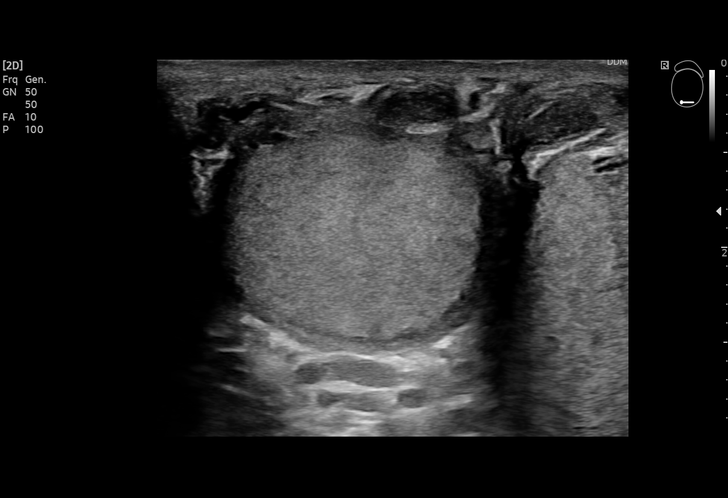
[im 23/45]
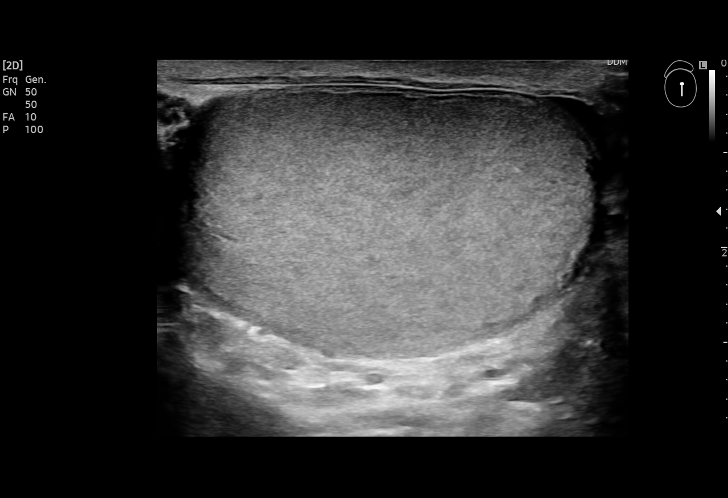
[im 26/45]
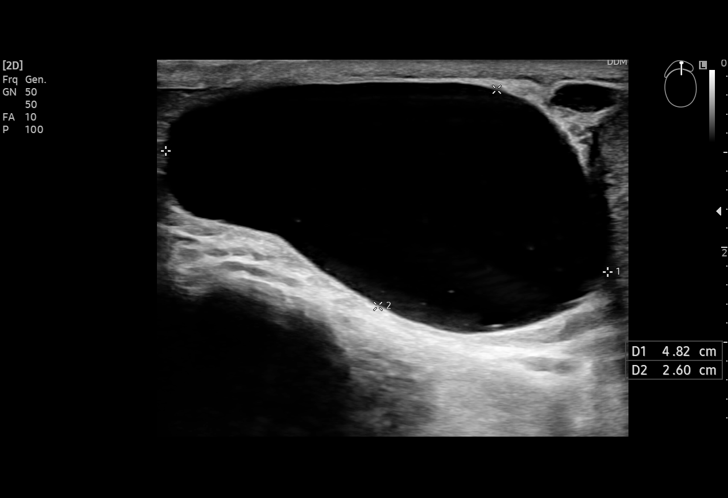
[im 28/45]
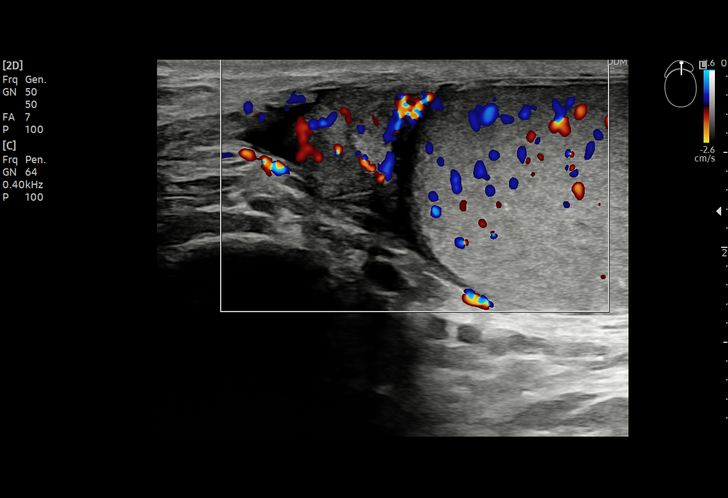
[im 32/45]
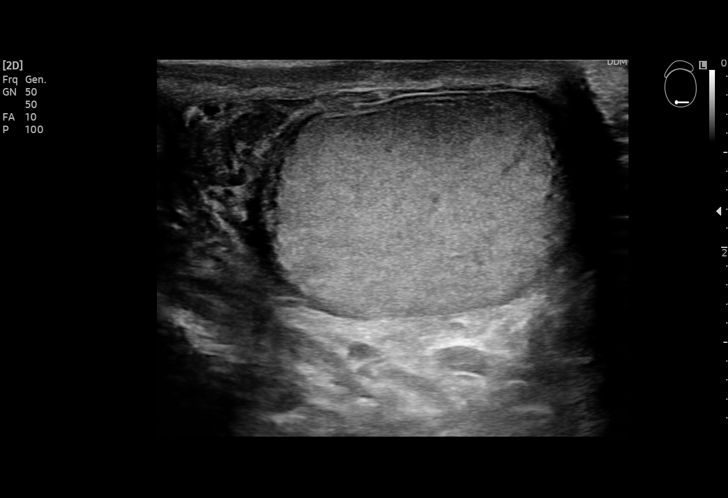
[im 35/45]
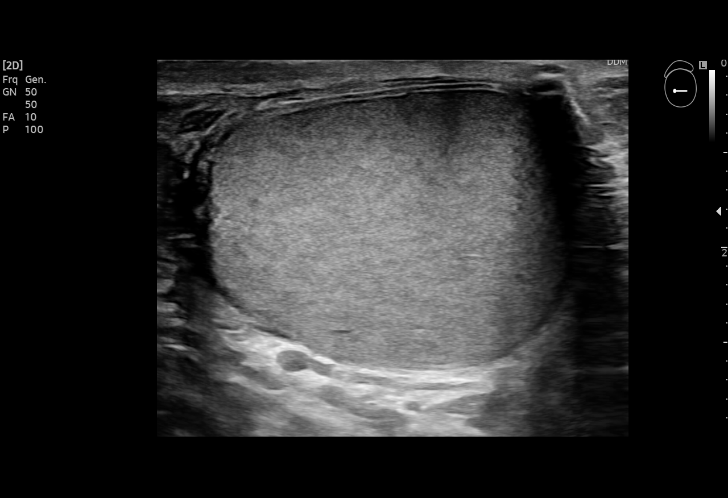
[im 37/45]
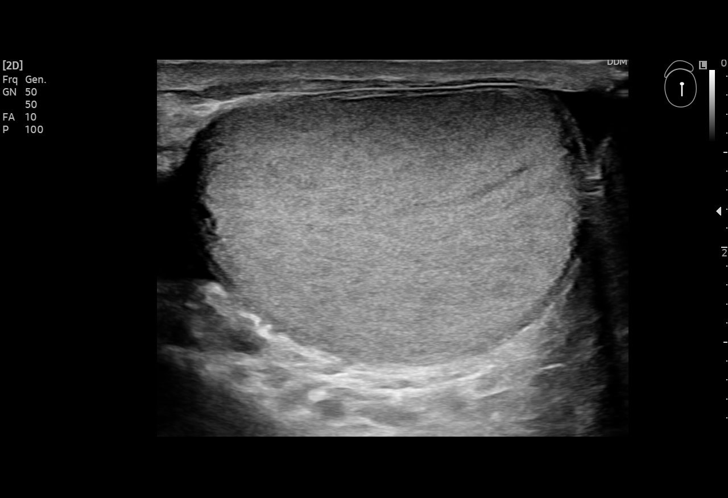
[im 41/45]
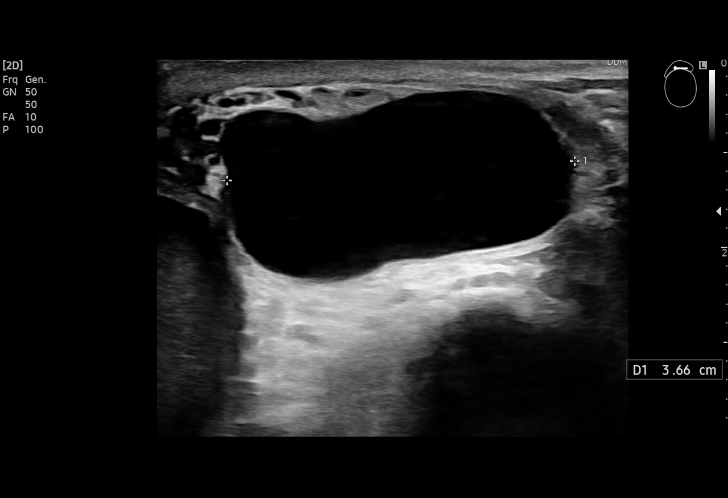
[im 45/45]
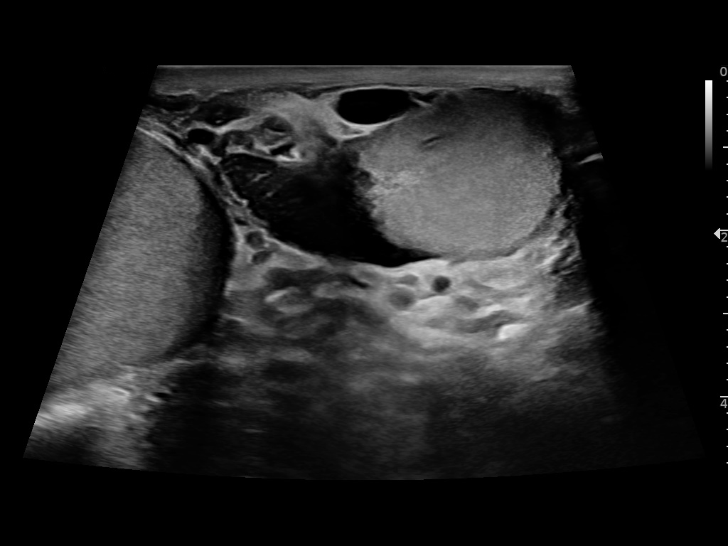

[15 of 25 positions shown; findings below may reference images not displayed]

FINDINGS: Right testicle

Measurements: 4.4 x 3.1 x 3.8 cm. No mass or microlithiasis
visualized.

Left testicle

Measurements: 4.0 x 2.8 x 3.8 cm. No mass or microlithiasis
visualized.

Right epididymis:  Multiple right epididymal cysts are seen.

Left epididymis: Previously seen large epididymal cyst is again
identified but has increased in size now measuring 4.8 x 2.6 x
cm. It previously measured 3.7 cm in greatest dimension.

Hydrocele:  None visualized.

Varicocele:  None visualized.

Pulsed Doppler interrogation of both testes demonstrates normal low
resistance arterial and venous waveforms bilaterally.
IMPRESSION: Epididymal cysts bilaterally left greater than right as described.
Although the cyst on the left has increased in size it remains
simple in nature.
